# Patient Record
Sex: Female | Born: 1985 | Race: White | Hispanic: No | Marital: Single | Smoking: Former smoker
Health system: Southern US, Community
[De-identification: ages and names within clinical notes are randomized; demographics above are authoritative.]

## PROBLEM LIST (undated history)

## (undated) DIAGNOSIS — F329 Major depressive disorder, single episode, unspecified: Secondary | ICD-10-CM

## (undated) DIAGNOSIS — F419 Anxiety disorder, unspecified: Secondary | ICD-10-CM

## (undated) DIAGNOSIS — F32A Depression, unspecified: Secondary | ICD-10-CM

## (undated) DIAGNOSIS — M549 Dorsalgia, unspecified: Secondary | ICD-10-CM

---

## 2005-10-14 ENCOUNTER — Emergency Department: Payer: Self-pay | Admitting: Emergency Medicine

## 2005-11-09 ENCOUNTER — Emergency Department: Payer: Self-pay | Admitting: Emergency Medicine

## 2005-12-26 ENCOUNTER — Emergency Department: Payer: Self-pay | Admitting: Unknown Physician Specialty

## 2006-03-28 ENCOUNTER — Emergency Department: Payer: Self-pay | Admitting: Emergency Medicine

## 2006-03-28 ENCOUNTER — Other Ambulatory Visit: Payer: Self-pay

## 2007-01-11 ENCOUNTER — Emergency Department: Payer: Self-pay | Admitting: General Practice

## 2007-02-01 ENCOUNTER — Emergency Department: Payer: Self-pay | Admitting: Emergency Medicine

## 2007-03-17 ENCOUNTER — Emergency Department: Payer: Self-pay | Admitting: Internal Medicine

## 2007-12-21 ENCOUNTER — Emergency Department: Payer: Self-pay | Admitting: Internal Medicine

## 2007-12-21 ENCOUNTER — Other Ambulatory Visit: Payer: Self-pay

## 2008-03-08 ENCOUNTER — Emergency Department: Payer: Self-pay | Admitting: Emergency Medicine

## 2008-07-10 ENCOUNTER — Emergency Department: Payer: Self-pay | Admitting: Emergency Medicine

## 2009-03-13 ENCOUNTER — Emergency Department: Payer: Self-pay | Admitting: Emergency Medicine

## 2012-08-30 ENCOUNTER — Ambulatory Visit: Payer: Self-pay | Admitting: Family Medicine

## 2013-02-04 DIAGNOSIS — A64 Unspecified sexually transmitted disease: Secondary | ICD-10-CM | POA: Insufficient documentation

## 2013-02-04 DIAGNOSIS — Z720 Tobacco use: Secondary | ICD-10-CM | POA: Insufficient documentation

## 2013-02-04 DIAGNOSIS — F419 Anxiety disorder, unspecified: Secondary | ICD-10-CM | POA: Insufficient documentation

## 2013-08-26 DIAGNOSIS — N643 Galactorrhea not associated with childbirth: Secondary | ICD-10-CM | POA: Insufficient documentation

## 2014-04-28 ENCOUNTER — Ambulatory Visit: Payer: Self-pay | Admitting: Family Medicine

## 2015-02-08 DIAGNOSIS — M797 Fibromyalgia: Secondary | ICD-10-CM | POA: Insufficient documentation

## 2015-03-16 DIAGNOSIS — F32A Depression, unspecified: Secondary | ICD-10-CM | POA: Insufficient documentation

## 2015-03-16 DIAGNOSIS — F419 Anxiety disorder, unspecified: Secondary | ICD-10-CM | POA: Insufficient documentation

## 2016-05-16 DIAGNOSIS — R202 Paresthesia of skin: Secondary | ICD-10-CM | POA: Insufficient documentation

## 2016-05-16 DIAGNOSIS — E041 Nontoxic single thyroid nodule: Secondary | ICD-10-CM | POA: Insufficient documentation

## 2016-06-07 DIAGNOSIS — D519 Vitamin B12 deficiency anemia, unspecified: Secondary | ICD-10-CM | POA: Insufficient documentation

## 2017-01-23 ENCOUNTER — Ambulatory Visit
Admission: EM | Admit: 2017-01-23 | Discharge: 2017-01-23 | Disposition: A | Discharge status: Home / Self Care | Payer: Medicaid Other | Attending: Family Medicine | Admitting: Family Medicine

## 2017-01-23 ENCOUNTER — Encounter: Payer: Self-pay | Admitting: *Deleted

## 2017-01-23 DIAGNOSIS — S29019A Strain of muscle and tendon of unspecified wall of thorax, initial encounter: Secondary | ICD-10-CM | POA: Diagnosis not present

## 2017-01-23 HISTORY — DX: Major depressive disorder, single episode, unspecified: F32.9

## 2017-01-23 HISTORY — DX: Depression, unspecified: F32.A

## 2017-01-23 HISTORY — DX: Anxiety disorder, unspecified: F41.9

## 2017-01-23 HISTORY — DX: Dorsalgia, unspecified: M54.9

## 2017-01-23 MED ORDER — HYDROCODONE-ACETAMINOPHEN 5-325 MG PO TABS: ORAL_TABLET | ORAL | 0 refills | Status: DC

## 2017-01-23 MED ORDER — CYCLOBENZAPRINE HCL 10 MG PO TABS: 10.0000 mg | ORAL_TABLET | Freq: Every day | ORAL | 0 refills | Status: DC

## 2017-01-23 MED ORDER — KETOROLAC TROMETHAMINE 60 MG/2ML IM SOLN
60.0000 mg | Freq: Once | INTRAMUSCULAR | Status: AC
  Administered 2017-01-23: 60 mg via INTRAMUSCULAR

## 2017-01-23 NOTE — ED Provider Notes (Addendum)
[facility]    CSN: [id number] Arrival date & time: 01/23/17  1548     History   Chief Complaint Chief Complaint  Patient presents with  . Back Pain    HPI Amber Vance is a 31 y.o. female.   The history is provided by the patient.  Back Pain  Location:  Thoracic spine Quality:  Aching Pain severity:  Moderate Pain is:  Same all the time Onset quality:  Sudden Duration:  3 days Timing:  Constant Progression:  Worsening Chronicity:  Recurrent (states has a chronic history of thoracic back problems/pain) Context: not emotional stress, not falling, not jumping from heights, not lifting heavy objects, not MCA, not MVA, not occupational injury, not pedestrian accident, not physical stress, not recent illness, not recent injury and not twisting   Relieved by:  Nothing Worsened by:  Twisting, bending and palpation Ineffective treatments:  OTC medications Associated symptoms: no abdominal pain, no abdominal swelling, no bladder incontinence, no bowel incontinence, no chest pain, no dysuria, no fever, no headaches, no leg pain, no numbness, no paresthesias, no pelvic pain, no perianal numbness, no tingling, no weakness and no weight loss   Risk factors: no hx of cancer, no hx of osteoporosis, no lack of exercise, no menopause, not obese, no recent surgery, no steroid use and no vascular disease     Past Medical History:  Diagnosis Date  . Anxiety   . Back pain   . Depression     There are no active problems to display for this patient.   History reviewed. No pertinent surgical history.  OB History    No data available       Home Medications    Prior to Admission medications   Medication Sig Start Date End Date Taking? Authorizing Provider  baclofen (LIORESAL) 10 MG tablet Take 10 mg by mouth 3 (three) times daily.   Yes Historical Provider, MD  hydrOXYzine (ATARAX/VISTARIL) 50 MG tablet Take 50 mg by mouth 3 (three) times daily as needed.   Yes  Historical Provider, MD  lamoTRIgine (LAMICTAL) 100 MG tablet Take 100 mg by mouth daily.   Yes Historical Provider, MD  meloxicam (MOBIC) 15 MG tablet Take 15 mg by mouth daily.   Yes Historical Provider, MD  metroNIDAZOLE (FLAGYL) 500 MG tablet Take 500 mg by mouth 3 (three) times daily.   Yes Historical Provider, MD  pregabalin (LYRICA) 100 MG capsule Take 100 mg by mouth 2 (two) times daily.   Yes Historical Provider, MD  cyclobenzaprine (FLEXERIL) 10 MG tablet Take 1 tablet (10 mg total) by mouth at bedtime. 01/23/17   Norval Gable, MD  HYDROcodone-acetaminophen (NORCO/VICODIN) 5-325 MG tablet 1-2 tabs po qd prn 01/23/17   Norval Gable, MD  medroxyPROGESTERone (DEPO-PROVERA) 150 MG/ML injection Inject 150 mg into the muscle every 3 (three) months.    Historical Provider, MD    Family History History reviewed. No pertinent family history.  Social History Social History  Substance Use Topics  . Smoking status: Current Every Day Smoker    Packs/day: 0.50    Types: Cigarettes  . Smokeless tobacco: Never Used  . Alcohol use Yes     Allergies   Patient has no known allergies.   Review of Systems Review of Systems  Constitutional: Negative for fever and weight loss.  Cardiovascular: Negative for chest pain.  Gastrointestinal: Negative for abdominal pain and bowel incontinence.  Genitourinary: Negative for bladder incontinence, dysuria and pelvic pain.  Musculoskeletal: Positive for  back pain.  Neurological: Negative for tingling, weakness, numbness, headaches and paresthesias.     Physical Exam Triage Vital Signs ED Triage Vitals  Enc Vitals Group     BP 01/23/17 1633 116/74     Pulse Rate 01/23/17 1633 (!) 101     Resp 01/23/17 1633 16     Temp 01/23/17 1633 98 F (36.7 C)     Temp Source 01/23/17 1633 Oral     SpO2 01/23/17 1633 100 %     Weight 01/23/17 1634 174 lb (78.9 kg)     Height 01/23/17 1634 5\' 5"  (1.651 m)     Head Circumference --      Peak Flow --       Pain Score 01/23/17 1634 10     Pain Loc --      Pain Edu? --      Excl. in [place]? --    No data found.   Updated Vital Signs BP 116/74 (BP Location: Left Arm)   Pulse (!) 101   Temp 98 F (36.7 C) (Oral)   Resp 16   Ht 5\' 5"  (1.651 m)   Wt 174 lb (78.9 kg)   SpO2 100%   BMI 28.96 kg/m   Visual Acuity Right Eye Distance:   Left Eye Distance:   Bilateral Distance:    Right Eye Near:   Left Eye Near:    Bilateral Near:     Physical Exam  Constitutional: She appears well-developed and well-nourished. No distress.  Musculoskeletal: She exhibits tenderness. She exhibits no edema.       Thoracic back: She exhibits tenderness (over the right thoracic paraspinous muscles). She exhibits normal range of motion, no bony tenderness, no swelling, no edema, no deformity, no laceration, no pain, no spasm and normal pulse.       Lumbar back: She exhibits normal range of motion, no tenderness, no bony tenderness, no swelling, no edema, no deformity, no laceration, no pain, no spasm and normal pulse.       Back:  Neurological: She is alert. She has normal reflexes. She displays normal reflexes. She exhibits normal muscle tone.  Skin: Skin is warm and dry. No rash noted. She is not diaphoretic. No erythema.  Nursing note and vitals reviewed.    UC Treatments / Results  Labs (all labs ordered are listed, but only abnormal results are displayed) Labs Reviewed - No data to display  EKG  EKG Interpretation None       Radiology No results found.  Procedures Procedures (including critical care time)  Medications Ordered in UC Medications  ketorolac (TORADOL) injection 60 mg (60 mg Intramuscular Given 01/23/17 1711)     Initial Impression / Assessment and Plan / UC Course  I have reviewed the triage vital signs and the nursing notes.  Pertinent labs & imaging results that were available during my care of the patient were reviewed by me and considered in my medical decision  making (see chart for details).       Final Clinical Impressions(s) / UC Diagnoses   Final diagnoses:  Thoracic myofascial strain, initial encounter    New Prescriptions New Prescriptions   CYCLOBENZAPRINE (FLEXERIL) 10 MG TABLET    Take 1 tablet (10 mg total) by mouth at bedtime.   HYDROCODONE-ACETAMINOPHEN (NORCO/VICODIN) 5-325 MG TABLET    1-2 tabs po qd prn   1.diagnosis reviewed with patient; patient given toradol 60mg  im x1 2. rx as per orders above; reviewed possible side effects,  interactions, risks and benefits  3. Recommend supportive treatment with rest, ice/heat, otc analgesics 4. Follow-up prn if symptoms worsen or don't improve   Norval Gable, MD 01/23/17 [place], MD 01/23/17 2633

## 2017-01-23 NOTE — ED Notes (Signed)
Patient waited 15 minutes and no reactions were noted. Injection site is unremarkable. 

## 2017-01-23 NOTE — ED Triage Notes (Addendum)
PAtient started having right side thoracic back pain 3 days ago. Patient reports the pain shoots up to her shoulder and around her right flank. Patient does have a history of right flank pain.

## 2017-02-09 DIAGNOSIS — M419 Scoliosis, unspecified: Secondary | ICD-10-CM | POA: Insufficient documentation

## 2017-02-09 DIAGNOSIS — M5416 Radiculopathy, lumbar region: Secondary | ICD-10-CM | POA: Insufficient documentation

## 2018-01-02 ENCOUNTER — Emergency Department: Payer: Medicaid Other

## 2018-01-02 ENCOUNTER — Emergency Department
Admission: EM | Admit: 2018-01-02 | Discharge: 2018-01-02 | Disposition: A | Discharge status: Home / Self Care | Payer: Medicaid Other | Attending: Student in an Organized Health Care Education/Training Program | Admitting: Student in an Organized Health Care Education/Training Program

## 2018-01-02 ENCOUNTER — Encounter: Payer: Self-pay | Admitting: Emergency Medicine

## 2018-01-02 ENCOUNTER — Other Ambulatory Visit: Payer: Self-pay

## 2018-01-02 DIAGNOSIS — F419 Anxiety disorder, unspecified: Secondary | ICD-10-CM | POA: Insufficient documentation

## 2018-01-02 DIAGNOSIS — O9989 Other specified diseases and conditions complicating pregnancy, childbirth and the puerperium: Secondary | ICD-10-CM | POA: Diagnosis not present

## 2018-01-02 DIAGNOSIS — R1032 Left lower quadrant pain: Secondary | ICD-10-CM | POA: Insufficient documentation

## 2018-01-02 DIAGNOSIS — F17210 Nicotine dependence, cigarettes, uncomplicated: Secondary | ICD-10-CM | POA: Insufficient documentation

## 2018-01-02 DIAGNOSIS — R109 Unspecified abdominal pain: Secondary | ICD-10-CM

## 2018-01-02 DIAGNOSIS — Z79899 Other long term (current) drug therapy: Secondary | ICD-10-CM | POA: Diagnosis not present

## 2018-01-02 DIAGNOSIS — Z3A10 10 weeks gestation of pregnancy: Secondary | ICD-10-CM

## 2018-01-02 LAB — HCG, QUANTITATIVE, PREGNANCY: hCG, Beta Chain, Quant, S: 108483 m[IU]/mL — ABNORMAL HIGH (ref <5)

## 2018-01-02 NOTE — ED Provider Notes (Signed)
[facility] Emergency Department Provider Note    None    (approximate)  I have reviewed the triage vital signs and the nursing notes.   HISTORY  Chief Complaint "worried about baby"    HPI Amber Vance is a 32 y.o. female history of anxiety presents roughly [redacted] weeks pregnant with concern about fetal well-being.  Denies any vaginal bleeding.  No pain.  No discharge.  No nausea or vomiting.  States that she felt really nervous this morning think that she was further along in her pregnancy and had not yet started feeling the baby kick so was worried that the baby had died so she came to the ER.  Past Medical History:  Diagnosis Date  . Anxiety   . Back pain   . Depression    History reviewed. No pertinent family history. History reviewed. No pertinent surgical history. There are no active problems to display for this patient.     Prior to Admission medications   Medication Sig Start Date End Date Taking? Authorizing Provider  baclofen (LIORESAL) 10 MG tablet Take 10 mg by mouth 3 (three) times daily.    [provider]  cyclobenzaprine (FLEXERIL) 10 MG tablet Take 1 tablet (10 mg total) by mouth at bedtime. 01/23/17   Norval Gable, MD  HYDROcodone-acetaminophen (NORCO/VICODIN) 5-325 MG tablet 1-2 tabs po qd prn 01/23/17   Norval Gable, MD  hydrOXYzine (ATARAX/VISTARIL) 50 MG tablet Take 50 mg by mouth 3 (three) times daily as needed.    [provider]  lamoTRIgine (LAMICTAL) 100 MG tablet Take 100 mg by mouth daily.    [provider]  medroxyPROGESTERone (DEPO-PROVERA) 150 MG/ML injection Inject 150 mg into the muscle every 3 (three) months.    [provider]  meloxicam (MOBIC) 15 MG tablet Take 15 mg by mouth daily.    [provider]  metroNIDAZOLE (FLAGYL) 500 MG tablet Take 500 mg by mouth 3 (three) times daily.    [provider]  pregabalin (LYRICA) 100 MG capsule Take 100 mg by  mouth 2 (two) times daily.    [provider]    Allergies Patient has no known allergies.    Social History Social History   Tobacco Use  . Smoking status: Current Every Day Smoker    Packs/day: 0.50    Types: Cigarettes  . Smokeless tobacco: Never Used  Substance Use Topics  . Alcohol use: Yes  . Drug use: No    Review of Systems Patient denies headaches, rhinorrhea, blurry vision, numbness, shortness of breath, chest pain, edema, cough, abdominal pain, nausea, vomiting, diarrhea, dysuria, fevers, rashes or hallucinations unless otherwise stated above in HPI. ____________________________________________   PHYSICAL EXAM:  VITAL SIGNS: Vitals:   01/02/18 1350  BP: 108/75  Pulse: 85  Resp: 17  Temp: 98.4 F (36.9 C)  SpO2: 100%    Constitutional: Alert and oriented. Well appearing and in no acute distress. Eyes: Conjunctivae are normal.  Head: Atraumatic. Nose: No congestion/rhinnorhea. Mouth/Throat: Mucous membranes are moist.   Neck: Painless ROM.  Cardiovascular:   Good peripheral circulation. Respiratory: Normal respiratory effort.  No retractions.  Gastrointestinal: Soft and nontender.  Musculoskeletal: No lower extremity tenderness .  No joint effusions. Neurologic:  Normal speech and language. No gross focal neurologic deficits are appreciated.  Skin:  Skin is warm, dry and intact. No rash noted. Psychiatric: Mood and affect are normal. Speech and behavior are normal.  ____________________________________________   LABS (all labs ordered  are listed, but only abnormal results are displayed)  Results for orders placed or performed during the hospital encounter of 01/02/18 (from the past 24 hour(s))  hCG, quantitative, pregnancy     Status: Abnormal   Collection Time: 01/02/18  2:13 PM  Result Value Ref Range   hCG, Beta Chain, Quant, S 324,401 (H) <5 mIU/mL    ____________________________________________ ____________________________________________  RADIOLOGY  I personally reviewed all radiographic images ordered to evaluate for the above acute complaints and reviewed radiology reports and findings.  These findings were personally discussed with the patient.  Please see medical record for radiology report.  ____________________________________________   PROCEDURES  Procedure(s) performed:  Procedures    Critical Care performed: no ____________________________________________   INITIAL IMPRESSION / ASSESSMENT AND PLAN / ED COURSE  Pertinent labs & imaging results that were available during my care of the patient were reviewed by me and considered in my medical decision making (see chart for details).  DDX: Threatened AB, ectopic, miscarriage, healthy pregnancy  Amber Vance is a 32 y.o. who presents to the ED with symptoms as described above.  Patient afebrile [name] dynamically stable.  Ultrasound ordered to evaluate for fetal well-being shows reassuring fetal ultrasound.  No evidence of ectopic.  No signs or symptoms to suggest miscarriage.  Patient stable and appropriate for outpatient follow-up.      ____________________________________________   FINAL CLINICAL IMPRESSION(S) / ED DIAGNOSES  Final diagnoses:  [redacted] weeks gestation of pregnancy      NEW MEDICATIONS STARTED DURING THIS VISIT:  New Prescriptions   No medications on file     Note:  This document was prepared using Dragon voice recognition software and may include unintentional dictation errors.    Merlyn Lot, MD 01/02/18 [phone]

## 2018-01-02 NOTE — ED Triage Notes (Signed)
Had a tight feeling in stomach this morning that has now eased.  Pt denies vaginal bleeding.  Was at doctor end February and they told her she was pregnant about 15-18 per HCG.  Was then incarcerated for couple days so has had no prenatal care.  Pt just wants to make sure baby is ok because nervous.  A4T3M4.

## 2018-01-02 NOTE — ED Notes (Signed)

## 2018-01-02 NOTE — ED Triage Notes (Signed)
Unable to find fetal heart tones with doppler in triage.

## 2018-02-26 DIAGNOSIS — O0992 Supervision of high risk pregnancy, unspecified, second trimester: Secondary | ICD-10-CM | POA: Insufficient documentation

## 2018-06-14 DIAGNOSIS — O99330 Smoking (tobacco) complicating pregnancy, unspecified trimester: Secondary | ICD-10-CM | POA: Insufficient documentation

## 2018-06-14 DIAGNOSIS — A630 Anogenital (venereal) warts: Secondary | ICD-10-CM | POA: Insufficient documentation

## 2018-07-19 DIAGNOSIS — Z3A39 39 weeks gestation of pregnancy: Secondary | ICD-10-CM | POA: Insufficient documentation

## 2018-12-25 DIAGNOSIS — M2012 Hallux valgus (acquired), left foot: Secondary | ICD-10-CM | POA: Insufficient documentation

## 2019-05-29 ENCOUNTER — Other Ambulatory Visit: Payer: Self-pay

## 2019-05-29 DIAGNOSIS — Z20822 Contact with and (suspected) exposure to covid-19: Secondary | ICD-10-CM

## 2019-05-30 LAB — NOVEL CORONAVIRUS, NAA: SARS-CoV-2, NAA: NOT DETECTED

## 2019-06-02 ENCOUNTER — Telehealth: Payer: Self-pay

## 2019-06-02 NOTE — Telephone Encounter (Signed)
Informed pt of negative covid result   °

## 2019-07-01 ENCOUNTER — Other Ambulatory Visit: Payer: Self-pay

## 2019-07-01 DIAGNOSIS — Z20822 Contact with and (suspected) exposure to covid-19: Secondary | ICD-10-CM

## 2019-07-02 LAB — NOVEL CORONAVIRUS, NAA: SARS-CoV-2, NAA: NOT DETECTED

## 2019-07-03 ENCOUNTER — Telehealth: Payer: Self-pay | Admitting: General Practice

## 2019-07-03 NOTE — Telephone Encounter (Signed)
Negative COVID results given. Patient results "NOT Detected." Caller expressed understanding. ° °

## 2019-08-25 ENCOUNTER — Other Ambulatory Visit: Payer: Self-pay

## 2019-08-25 DIAGNOSIS — Z20822 Contact with and (suspected) exposure to covid-19: Secondary | ICD-10-CM

## 2019-08-26 LAB — NOVEL CORONAVIRUS, NAA: SARS-CoV-2, NAA: NOT DETECTED

## 2019-08-27 ENCOUNTER — Telehealth: Payer: Self-pay

## 2019-08-27 NOTE — Telephone Encounter (Signed)
Negative COVID results given. Patient results "NOT Detected." Caller expressed understanding. ° °

## 2020-01-09 ENCOUNTER — Encounter: Payer: Self-pay | Admitting: Emergency Medicine

## 2020-01-09 ENCOUNTER — Other Ambulatory Visit: Payer: Self-pay

## 2020-01-09 ENCOUNTER — Emergency Department: Payer: Medicaid Other

## 2020-01-09 ENCOUNTER — Emergency Department
Admission: EM | Admit: 2020-01-09 | Discharge: 2020-01-09 | Disposition: A | Discharge status: Home / Self Care | Payer: Medicaid Other | Attending: Emergency Medicine | Admitting: Emergency Medicine

## 2020-01-09 DIAGNOSIS — R2242 Localized swelling, mass and lump, left lower limb: Secondary | ICD-10-CM | POA: Diagnosis not present

## 2020-01-09 DIAGNOSIS — F17210 Nicotine dependence, cigarettes, uncomplicated: Secondary | ICD-10-CM | POA: Diagnosis not present

## 2020-01-09 DIAGNOSIS — M25572 Pain in left ankle and joints of left foot: Secondary | ICD-10-CM | POA: Insufficient documentation

## 2020-01-09 MED ORDER — IBUPROFEN 800 MG PO TABS: 800.0000 mg | ORAL_TABLET | Freq: Three times a day (TID) | ORAL | 0 refills | Status: DC | PRN

## 2020-01-09 NOTE — ED Triage Notes (Signed)
Pt ambulatory to triage with c/o left ankle swelling and pain since Tuesday.  Pt denies injury.  States started after a "long shift" at work.  PMS intact.  Swelling does not extend past ankle.

## 2020-01-09 NOTE — ED Notes (Signed)
See triage note States she developed pain to left ankle  Denies any recent injury  Noticed swelling 3 days ago  Ambulates well  Good pulses

## 2020-01-09 NOTE — ED Provider Notes (Signed)
[facility] Emergency Department Provider Note       Time seen: ----------------------------------------- 1:10 PM on 01/09/2020 -----------------------------------------   I have reviewed the triage vital signs and the nursing notes.  HISTORY   Chief Complaint Ankle Pain    HPI Amber Vance is a 34 y.o. female with a history of anxiety, back pain, depression who presents to the ED for left ankle pain and swelling since Tuesday.  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  Discomfort is 2 out of 10.  Past Medical History:  Diagnosis Date  . Anxiety   . Back pain   . Depression     There are no problems to display for this patient.   History reviewed. No pertinent surgical history.  Allergies Patient has no known allergies.  Social History Social History   Tobacco Use  . Smoking status: Current Every Day Smoker    Packs/day: 0.50    Types: Cigarettes  . Smokeless tobacco: Never Used  Substance Use Topics  . Alcohol use: Yes  . Drug use: No    Review of Systems Constitutional: Negative for fever. Musculoskeletal: Positive for ankle pain Skin: Negative for rash. Neurological: Negative for headaches, focal weakness or numbness.  All systems negative/normal/unremarkable except as stated in the HPI  ____________________________________________   PHYSICAL EXAM:  VITAL SIGNS: ED Triage Vitals [01/09/20 1259]  Enc Vitals Group     BP 134/80     Pulse      Resp 18     Temp 98 F (36.7 C)     Temp Source Oral     SpO2 100 %     Weight 179 lb (81.2 kg)     Height 5\' 5"  (1.651 m)     Head Circumference      Peak Flow      Pain Score 2     Pain Loc      Pain Edu?      Excl. in [place]?     Constitutional: Alert and oriented. Well appearing and in no distress. Musculoskeletal: No significant pain with range of motion of the ankle, there is mild to moderate left ankle swelling, particular  over the lateral aspect compared to the right Neurologic:  Normal speech and language. No gross focal neurologic deficits are appreciated.  Skin:  Skin is warm, dry and intact. No rash noted. Psychiatric: Mood and affect are normal. Speech and behavior are normal.  ____________________________________________  ED COURSE:  As part of my medical decision making, I reviewed the following data within the [place] History obtained from family if available, nursing notes, old chart and ekg, as well as notes from prior ED visits. Patient presented for left ankle pain and swelling, we will assess with labs and imaging as indicated at this time.   Procedures  Amber Vance was evaluated in Emergency Department on 01/09/2020 for the symptoms described in the history of present illness. She was evaluated in the context of the global COVID-19 pandemic, which necessitated consideration that the patient might be at risk for infection with the SARS-CoV-2 virus that causes COVID-19. Institutional protocols and algorithms that pertain to the evaluation of patients at risk for COVID-19 are in a state of rapid change based on information released by regulatory bodies including the CDC and federal and state organizations. These policies and algorithms were followed during the patient's care in the ED.  ____________________________________________   RADIOLOGY  Images were viewed by me  Left ankle x-ray IMPRESSION:  Negative.  ____________________________________________   DIFFERENTIAL DIAGNOSIS   Peripheral edema, sprain, fracture unlikely, venous stasis  FINAL ASSESSMENT AND PLAN  Ankle pain and swelling   Plan: The patient had presented for left ankle pain and swelling. Patient's imaging did not reveal any acute process.  I will encourage TED hose if she is going to be standing for long periods of time.  X-rays were reassuring.  She is cleared for outpatient  follow-up.   Laurence Aly, MD    Note: This note was generated in part or whole with voice recognition software. Voice recognition is usually quite accurate but there are transcription errors that can and very often do occur. I apologize for any typographical errors that were not detected and corrected.     Earleen Newport, MD 01/09/20 1350

## 2020-01-29 ENCOUNTER — Other Ambulatory Visit: Payer: Self-pay

## 2020-01-29 ENCOUNTER — Ambulatory Visit (INDEPENDENT_AMBULATORY_CARE_PROVIDER_SITE_OTHER): Payer: Medicaid Other

## 2020-01-29 ENCOUNTER — Ambulatory Visit: Payer: Medicaid Other | Admitting: Podiatry

## 2020-01-29 ENCOUNTER — Other Ambulatory Visit: Payer: Self-pay | Admitting: Podiatry

## 2020-01-29 ENCOUNTER — Encounter: Payer: Self-pay | Admitting: Podiatry

## 2020-01-29 DIAGNOSIS — M778 Other enthesopathies, not elsewhere classified: Secondary | ICD-10-CM

## 2020-01-29 DIAGNOSIS — M7752 Other enthesopathy of left foot: Secondary | ICD-10-CM | POA: Diagnosis not present

## 2020-01-29 DIAGNOSIS — M7751 Other enthesopathy of right foot: Secondary | ICD-10-CM

## 2020-01-29 DIAGNOSIS — M79671 Pain in right foot: Secondary | ICD-10-CM

## 2020-01-30 ENCOUNTER — Encounter: Payer: Self-pay | Admitting: Podiatry

## 2020-01-30 NOTE — Progress Notes (Signed)
Subjective:  Patient ID: Amber Vance, female    DOB: [birth year],  MRN: [id number]  Chief Complaint  Patient presents with  . Foot Pain    Patient presents today for left ankle pain and right foot pain  . Ankle Pain    34 y.o. female presents with the above complaint.  Patient presents with complaint of left first interspace neuroma and right second metatarsophalangeal joint capsulitis.  Patient states that this has been going on for quite some time.  Patient states that his foot feels painful feels like that they are broken and is been going from past 2 to 3 days.  She states is worse when walking.  She has tried Lyrica is on gabapentin and ibuprofen but has not helped.  She also has secondary complaint of left ankle pain.  There was swelling associated with it for past 2 weeks.  She had x-rays done at the ER which showed no fractures.  She states that there is shooting pain up the leg.  There is also pain on top of the foot.  The cortisone injection in the past has been injected in the foot which does give her temporary relief.  She denies any other acute complaints.  She states that she has seen previous physician in the past long time ago but has not followed up with him after the injections.  She states that she could works on her feet all the time and she does not have good support of the arch as well as shoes.   Review of Systems: Negative except as noted in the HPI. Denies N/V/F/Ch.  Past Medical History:  Diagnosis Date  . Anxiety   . Back pain   . Depression     Current Outpatient Medications:  .  baclofen (LIORESAL) 10 MG tablet, Take 10 mg by mouth 3 (three) times daily., Disp: , Rfl:  .  gabapentin (NEURONTIN) 800 MG tablet, Take 800 mg by mouth 3 (three) times daily., Disp: , Rfl:  .  hydrOXYzine (ATARAX/VISTARIL) 50 MG tablet, Take 50 mg by mouth 3 (three) times daily as needed., Disp: , Rfl:  .  meloxicam (MOBIC) 15 MG tablet, Take 15 mg by mouth daily., Disp: ,  Rfl:  .  pregabalin (LYRICA) 100 MG capsule, Take 100 mg by mouth 2 (two) times daily., Disp: , Rfl:  .  Vitamin D, Ergocalciferol, (DRISDOL) 1.25 MG (50000 UNIT) CAPS capsule, Take 50,000 Units by mouth once a week., Disp: , Rfl:   Social History   Tobacco Use  Smoking Status Current Every Day Smoker  . Packs/day: 0.50  . Types: Cigarettes  Smokeless Tobacco Never Used    Allergies  Allergen Reactions  . Duloxetine Swelling    Swollen eyelids   Objective:  There were no vitals filed for this visit. There is no height or weight on file to calculate BMI. Constitutional Well developed. Well nourished.  Vascular Dorsalis pedis pulses palpable bilaterally. Posterior tibial pulses palpable bilaterally. Capillary refill normal to all digits.  No cyanosis or clubbing noted. Pedal hair growth normal.  Neurologic Normal speech. Oriented to person, place, and time. Epicritic sensation to light touch grossly present bilaterally.  Dermatologic Nails well groomed and normal in appearance. No open wounds. No skin lesions.  Orthopedic:  Pain on palpation to the right second metatarsophalangeal joint.  Pain with range of motion.  No extensor or flexor pain noted with range of motion.  Negative Tinel's sign.  No neuromas noted.  Left first interspace  Tinel's sign noted.  Positive Mulder's click.  Pain on palpation to the first interspace left     Radiographs: 3 views of skeletally mature adult right foot.  No stress fractures noted.  No periosteal reactions noted.  There is a bunion deformity present with sesamoid position of 6 out of 7.  There is hallux valgus deformity.  There is increase in intermetatarsal angle.  No hammertoe contractures noted.  No arthritic changes noted. Assessment:   1. Right foot pain    Plan:  Patient was evaluated and treated and all questions answered.  Right second metatarsophalangeal joint capsulitis -I explained to the patient the etiology of capsulitis  and various treatment options were discussed.  I believe she will benefit from a steroid injection to help decreasing acute inflammatory component associated with this.  Patient agrees with the plan would like to proceed with injection. -A steroid injection was performed at right second metatarsophalangeal joint using 1% plain Lidocaine and 10 mg of Kenalog. This was well tolerated. -Shoe gear modifications were extensively discussed with the patient. -Metatarsal pads were dispensed  Left first interspace neuroma -I explained to the patient the etiology of neuroma and various treatment options were extensively discussed.  However, clinically it is difficult to rule out capsulitis as well.  I plan on performing a steroid injection into rule out capsulitis if her pain has decreased with steroid injection it was likely capsulitis as opposed to neuroma.  Patient states understanding like to proceed with injection. -A steroid injection was performed at left 1st interspace using 1% plain Lidocaine and 10 mg of Kenalog. This was well tolerated. -Metatarsal pads were dispensed  Pes planus semiflexible -I explained to the patient the etiology of pes planus deformity and various treatment options were discussed extensively.  Given the pain that she is having in the right second metatarsophalangeal joint as well as left first interspace I believe she will benefit from custom-made orthotics to help support the arch of the foot as well as control the hindfoot motion.  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  I believe good arch support will definitely help with this. -Patient will be scheduled to see [facility] for custom-made orthotics   No follow-ups on file.

## 2020-02-03 ENCOUNTER — Ambulatory Visit: Payer: Self-pay | Admitting: Podiatry

## 2020-02-09 ENCOUNTER — Telehealth: Payer: Self-pay | Admitting: Podiatry

## 2020-02-09 NOTE — Telephone Encounter (Signed)
I spoke with patient, she stated that she is really wanting an injection because her heel and ankle is now hurting really bad from walking differently from the neuroma.  She stated she has tried ice, heat, elevations, Epson salt soaks and nothing works.  She states, "Im just looking forward to an injection tomorrow"   I instructed her to keep foot elevated and use ice and we will see her tomorrow

## 2020-02-09 NOTE — Telephone Encounter (Signed)
Pt has appt tomorrow with Dr Amalia Hailey for f/u after injections-having a lot of pain now in right ankle.  Pt would like a call today because she has questions re: injections and other treatments available.  Pt had appt tomorrow also with Liliane Channel for orthotics but she cancelled because she feels its not the right time.

## 2020-02-10 ENCOUNTER — Other Ambulatory Visit: Payer: Medicaid Other | Admitting: Orthotics

## 2020-02-10 ENCOUNTER — Ambulatory Visit: Payer: Medicaid Other | Admitting: Podiatry

## 2020-02-10 ENCOUNTER — Encounter: Payer: Self-pay | Admitting: Podiatry

## 2020-02-10 ENCOUNTER — Other Ambulatory Visit: Payer: Self-pay

## 2020-02-10 DIAGNOSIS — M722 Plantar fascial fibromatosis: Secondary | ICD-10-CM

## 2020-02-10 DIAGNOSIS — M7661 Achilles tendinitis, right leg: Secondary | ICD-10-CM | POA: Diagnosis not present

## 2020-02-10 DIAGNOSIS — M79671 Pain in right foot: Secondary | ICD-10-CM

## 2020-02-10 DIAGNOSIS — D3610 Benign neoplasm of peripheral nerves and autonomic nervous system, unspecified: Secondary | ICD-10-CM

## 2020-02-11 ENCOUNTER — Encounter: Payer: Self-pay | Admitting: Podiatry

## 2020-02-11 NOTE — Progress Notes (Signed)
Subjective:  Patient ID: Amber Vance, female    DOB: 1985-10-29,  MRN: [id number]  Chief Complaint  Patient presents with  . Foot Pain    Patient presents today for right heel/ankle/leg pain which started after her last visit.  She states "I think I was walking on my heel and walking wrong from the neuroma"  She request an injection today    34 y.o. female presents with the above complaint.  Patient presents with a follow-up of left first interspace neuroma as well as right second metatarsophalangeal joint capsulitis.  She states the injection did help a lot.  However she has different complaints today with right plantar fasciitis as well as right Achilles tendinitis.  She states that this started bothering her quite some time ago.  And has progressively gotten worse.  She states she is very active on her foot and therefore the pain has considerably gotten worse.  Patient states is very severe pain scale 10 out of 10.  She would like to know if she can get injection to help with this.  Gabapentin is not helping.  She denies any other acute complaints.   Review of Systems: Negative except as noted in the HPI. Denies N/V/F/Ch.  Past Medical History:  Diagnosis Date  . Anxiety   . Back pain   . Depression     Current Outpatient Medications:  .  baclofen (LIORESAL) 10 MG tablet, Take 10 mg by mouth 3 (three) times daily., Disp: , Rfl:  .  gabapentin (NEURONTIN) 800 MG tablet, Take 800 mg by mouth 3 (three) times daily., Disp: , Rfl:  .  hydrOXYzine (ATARAX/VISTARIL) 50 MG tablet, Take 50 mg by mouth 3 (three) times daily as needed., Disp: , Rfl:  .  meloxicam (MOBIC) 15 MG tablet, Take 15 mg by mouth daily., Disp: , Rfl:  .  pregabalin (LYRICA) 100 MG capsule, Take 100 mg by mouth 2 (two) times daily., Disp: , Rfl:  .  Vitamin D, Ergocalciferol, (DRISDOL) 1.25 MG (50000 UNIT) CAPS capsule, Take 50,000 Units by mouth once a week., Disp: , Rfl:   Social History   Tobacco Use    Smoking Status Current Every Day Smoker  . Packs/day: 0.50  . Types: Cigarettes  Smokeless Tobacco Never Used    Allergies  Allergen Reactions  . Duloxetine Swelling    Swollen eyelids   Objective:  There were no vitals filed for this visit. There is no height or weight on file to calculate BMI. Constitutional Well developed. Well nourished.  Vascular Dorsalis pedis pulses palpable bilaterally. Posterior tibial pulses palpable bilaterally. Capillary refill normal to all digits.  No cyanosis or clubbing noted. Pedal hair growth normal.  Neurologic Normal speech. Oriented to person, place, and time. Epicritic sensation to light touch grossly present bilaterally.  Dermatologic Nails well groomed and normal in appearance. No open wounds. No skin lesions.  Orthopedic:  Pain on palpation to the right second metatarsophalangeal joint.  Pain with range of motion.  No extensor or flexor pain noted with range of motion.  Negative Tinel's sign.  No neuromas noted.  Left first interspace Tinel's sign noted.  Positive Mulder's click.  Pain on palpation to the first interspace left     Radiographs: 3 views of skeletally mature adult right foot.  No stress fractures noted.  No periosteal reactions noted.  There is a bunion deformity present with sesamoid position of 6 out of 7.  There is hallux valgus deformity.  There is increase  in intermetatarsal angle.  No hammertoe contractures noted.  No arthritic changes noted. Assessment:   1. Right foot pain   2. Plantar fasciitis of right foot   3. Achilles tendinitis, right leg    Plan:  Patient was evaluated and treated and all questions answered.  Right Achilles tendinitis -I explained the patient the etiology of Achilles tendinitis and its relationship with plantar fasciitis and various treatment options were extensively discussed.  I believe patient will also benefit from a steroid injection to help decrease the inflammatory component  associated with pain.  Patient states understanding and would like to proceed with injection.  I did discuss with the patient in extensive detail that there is a high risk of rupture with steroid injections.  Patient states understanding and would like to proceed despite the risks. -A steroid injection was performed at right Achilles Kager's fat using 1% plain Lidocaine and 10 mg of Kenalog. This was well tolerated.   Right plantar fasciitis -I explained to the patient the etiology of plantar fasciitis and various treatment options were extensively discussed.  I believe patient will benefit from steroid injection to help decrease the acute inflammatory component associated pain.  Patient states understanding would like to proceed with injection. A steroid injection was performed at right plantar heel using 1% plain Lidocaine and 10 mg of Kenalog. This was well tolerated. -Plantar fascial brace was also dispensed  Right second metatarsophalangeal joint capsulitis -Resolving with a steroid injection.  I will hold off on any further injection as the pain has resolved. -Metatarsal pads are helping.  Left first interspace neuroma -Resolving with a steroid injection.  I will hold off on any further injection as the pain has resolved. -Metatarsal pads were dispensed  Pes planus semiflexible -I explained to the patient the etiology of pes planus deformity and various treatment options were discussed extensively.  Given the pain that she is having in the right second metatarsophalangeal joint as well as left first interspace I believe she will benefit from custom-made orthotics to help support the arch of the foot as well as control the hindfoot motion.  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  I believe good arch support will definitely help with this. -I will discuss with her about custom-made orthotics in the  future.   No follow-ups on file.

## 2020-02-12 ENCOUNTER — Other Ambulatory Visit: Payer: Medicaid Other

## 2020-03-18 ENCOUNTER — Other Ambulatory Visit: Payer: Self-pay

## 2020-03-18 ENCOUNTER — Ambulatory Visit: Payer: Medicaid Other | Admitting: Podiatry

## 2020-03-18 DIAGNOSIS — M79671 Pain in right foot: Secondary | ICD-10-CM

## 2020-03-18 DIAGNOSIS — M722 Plantar fascial fibromatosis: Secondary | ICD-10-CM

## 2020-03-18 DIAGNOSIS — M7671 Peroneal tendinitis, right leg: Secondary | ICD-10-CM

## 2020-03-18 DIAGNOSIS — M7661 Achilles tendinitis, right leg: Secondary | ICD-10-CM

## 2020-03-24 ENCOUNTER — Encounter: Payer: Self-pay | Admitting: Podiatry

## 2020-03-24 NOTE — Progress Notes (Signed)
Subjective:  Patient ID: Amber Vance, female    DOB: 21-Feb-1986,  MRN: [id number]  Chief Complaint  Patient presents with  . Foot Pain    pt is here for right foot pain, pt states that the right foot is still in pain since the last time she was here, pt states that the pain has moved to the lateral side of the right foot, and is looking to get it looked at.    34 y.o. female presents with the above complaint.  Patient presents with a follow-up of left first interspace neuroma as well as right second metatarsophalangeal joint capsulitis.  She states the injection did help a lot.  However she has different complaints today with right plantar fasciitis as well as right Achilles tendinitis.  She states that this started bothering her quite some time ago.  And has progressively gotten worse.  She states she is very active on her foot and therefore the pain has considerably gotten worse.  Patient states is very severe pain scale 10 out of 10.  She would like to know if she can get injection to help with this.  Gabapentin is not helping.  She denies any other acute complaints.   Review of Systems: Negative except as noted in the HPI. Denies N/V/F/Ch.  Past Medical History:  Diagnosis Date  . Anxiety   . Back pain   . Depression     Current Outpatient Medications:  .  baclofen (LIORESAL) 10 MG tablet, Take 10 mg by mouth 3 (three) times daily., Disp: , Rfl:  .  gabapentin (NEURONTIN) 800 MG tablet, Take 800 mg by mouth 3 (three) times daily., Disp: , Rfl:  .  hydrOXYzine (ATARAX/VISTARIL) 50 MG tablet, Take 50 mg by mouth 3 (three) times daily as needed., Disp: , Rfl:  .  medroxyPROGESTERone (DEPO-PROVERA) 150 MG/ML injection, Inject into the muscle., Disp: , Rfl:  .  meloxicam (MOBIC) 15 MG tablet, Take 15 mg by mouth daily., Disp: , Rfl:  .  pregabalin (LYRICA) 100 MG capsule, Take 100 mg by mouth 2 (two) times daily., Disp: , Rfl:  .  Vitamin D, Ergocalciferol, (DRISDOL) 1.25 MG  (50000 UNIT) CAPS capsule, Take 50,000 Units by mouth once a week., Disp: , Rfl:   Social History   Tobacco Use  Smoking Status Current Every Day Smoker  . Packs/day: 0.50  . Types: Cigarettes  Smokeless Tobacco Never Used    Allergies  Allergen Reactions  . Duloxetine Swelling    Swollen eyelids   Objective:  There were no vitals filed for this visit. There is no height or weight on file to calculate BMI. Constitutional Well developed. Well nourished.  Vascular Dorsalis pedis pulses palpable bilaterally. Posterior tibial pulses palpable bilaterally. Capillary refill normal to all digits.  No cyanosis or clubbing noted. Pedal hair growth normal.  Neurologic Normal speech. Oriented to person, place, and time. Epicritic sensation to light touch grossly present bilaterally.  Dermatologic Nails well groomed and normal in appearance. No open wounds. No skin lesions.  Orthopedic:  No pain on palpation to the right second metatarsophalangeal joint.  Pain with range of motion.  No extensor or flexor pain noted with range of motion.  Negative Tinel's sign.  No neuromas noted.  No Mulder's click no pain on palpation to the first interspace.  Pain on palpation of right peroneal tendon along the course of the tendon.  No pain at the insertion of the tendon.  Pain with inversion and plantarflexion of  the tendon.  No pain with eversion.  These findings are consistent with both active and passive range of motion.     Radiographs: 3 views of skeletally mature adult right foot.  No stress fractures noted.  No periosteal reactions noted.  There is a bunion deformity present with sesamoid position of 6 out of 7.  There is hallux valgus deformity.  There is increase in intermetatarsal angle.  No hammertoe contractures noted.  No arthritic changes noted. Assessment:   1. Plantar fasciitis of right foot   2. Achilles tendinitis, right leg   3. Right foot pain   4. Peroneal tendinitis, right     Plan:  Patient was evaluated and treated and all questions answered.  Right Achilles tendinitis -Resolved with a steroid injection and continue stretching exercises.  I discussed this with patient extensive detail.  Right peroneal tendinitis -I explained to patient the etiology of peroneal tendinitis and various treatment options were extensively discussed.  We discussed with her that she is very aggressive on her foot and it seems to be aggravating various tendon pathology.  I believe she will benefit from a steroid injection help decrease the acute inflammatory component associated with pain.  Patient states understanding would like purchasing with the injection.  I explained to the patient that given that this near the tendon and the tendon sheath there is a high risk of rupture.  Patient still wishes to give the injection. -A steroid injection was performed at right peroneal tendinitis using 1% plain Lidocaine and 10 mg of Kenalog. This was well tolerated. -If there is no resolve meant of pain we will consider getting an MRI of as well as a cam boot immobilization.  Patient agrees with plan    Right plantar fasciitis -Resolved with a steroid injection and plantar fascial brace.  Right second metatarsophalangeal joint capsulitis -Resolving with a steroid injection.  I will hold off on any further injection as the pain has resolved. -Metatarsal pads are helping.  Left first interspace neuroma -Resolving with a steroid injection.  I will hold off on any further injection as the pain has resolved. -Metatarsal pads were dispensed  Pes planus semiflexible -I explained to the patient the etiology of pes planus deformity and various treatment options were discussed extensively.  Given the pain that she is having in the right second metatarsophalangeal joint as well as left first interspace I believe she will benefit from custom-made orthotics to help support the arch of the foot as well as  control the hindfoot motion.  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  I believe good arch support will definitely help with this. -I will discuss with her about custom-made orthotics in the future.   No follow-ups on file.

## 2020-04-05 ENCOUNTER — Ambulatory Visit: Payer: Medicaid Other | Admitting: Podiatry

## 2020-04-15 ENCOUNTER — Ambulatory Visit: Payer: Medicaid Other | Admitting: Podiatry

## 2020-05-10 ENCOUNTER — Other Ambulatory Visit: Payer: Self-pay

## 2020-05-10 ENCOUNTER — Emergency Department: Payer: Medicaid Other

## 2020-05-10 ENCOUNTER — Telehealth: Payer: Self-pay | Admitting: Podiatry

## 2020-05-10 ENCOUNTER — Emergency Department
Admission: EM | Admit: 2020-05-10 | Discharge: 2020-05-10 | Disposition: A | Discharge status: Home / Self Care | Payer: Medicaid Other | Attending: Emergency Medicine | Admitting: Emergency Medicine

## 2020-05-10 DIAGNOSIS — I82462 Acute embolism and thrombosis of left calf muscular vein: Secondary | ICD-10-CM

## 2020-05-10 DIAGNOSIS — M79605 Pain in left leg: Secondary | ICD-10-CM | POA: Diagnosis present

## 2020-05-10 DIAGNOSIS — F17210 Nicotine dependence, cigarettes, uncomplicated: Secondary | ICD-10-CM | POA: Insufficient documentation

## 2020-05-10 DIAGNOSIS — I82402 Acute embolism and thrombosis of unspecified deep veins of left lower extremity: Secondary | ICD-10-CM | POA: Insufficient documentation

## 2020-05-10 DIAGNOSIS — M7989 Other specified soft tissue disorders: Secondary | ICD-10-CM

## 2020-05-10 MED ORDER — APIXABAN (ELIQUIS) VTE STARTER PACK (10MG AND 5MG): ORAL_TABLET | ORAL | 0 refills | Status: DC

## 2020-05-10 NOTE — ED Triage Notes (Signed)
Pt comes in POV with left lower leg pain/swelling/color change. Able to find pedal pulse. Swelling starting to go past knee. Started about a week ago.

## 2020-05-10 NOTE — Discharge Instructions (Addendum)
Your evaluation was incomplete at the time of your discharge. You had to leave prior to the completion of your work-up for leg swelling and bruising. Return to this ED immediately for further evaluation and management.

## 2020-05-10 NOTE — ED Provider Notes (Signed)
[facility] Emergency Department Provider Note ____________________________________________  Time seen: 1651  I have reviewed the triage vital signs and the nursing notes.  HISTORY  Chief Complaint  Leg Pain  HPI Amber Vance is a 34 y.o. female presents her self to the ED at the advice of her urgent care provider, for evaluation of left leg pain, swelling, and bruising.  Patient describes  about a 1 week complaint of swelling that started at the left ankle, has progressed to the anterior shin.  Patient denies any injury, trauma, sprain, or fall.  She does report pain with outpatient over the foot and ankle.  Also reporting tenderness and pain over the anterior shin.  Patient denies any chest pain, shortness of breath, cough, or fevers.  She also denies any history of DVT/PE.  She does have a lumbar radiculopathy for which she takes gabapentin, meloxicam, and baclofen. She is a current smoker but denies OCP or recent prolonged travel.   Past Medical History:  Diagnosis Date  . Anxiety   . Back pain   . Depression     Patient Active Problem List   Diagnosis Date Noted  . Hav (hallux abducto valgus), left 12/25/2018  . [redacted] weeks gestation of pregnancy 07/19/2018  . HPV (human papilloma virus) anogenital infection 06/14/2018  . Tobacco smoking affecting pregnancy 06/14/2018  . Lumbar radiculopathy 02/09/2017  . Scoliosis of thoracolumbar spine 02/09/2017  . Anemia due to vitamin B12 deficiency 06/07/2016  . Paresthesia 05/16/2016  . Thyroid nodule 05/16/2016  . Anxiety and depression 03/16/2015  . Fibromyalgia 02/08/2015  . Galactorrhea 08/26/2013  . Anxiety 02/04/2013  . STD (female) 02/04/2013  . Tobacco use 02/04/2013    History reviewed. No pertinent surgical history.  Prior to Admission medications   Medication Sig Start Date End Date Taking? Authorizing Provider  APIXABAN (ELIQUIS) VTE STARTER PACK (10MG  AND 5MG ) Take as directed on package:  start with two-5mg  tablets twice daily for 7 days. On day 8, switch to one-5mg  tablet twice daily. 05/10/20   Aimi Essner, Dannielle Karvonen, PA-C  baclofen (LIORESAL) 10 MG tablet Take 10 mg by mouth 3 (three) times daily.    [provider]  gabapentin (NEURONTIN) 800 MG tablet Take 800 mg by mouth 3 (three) times daily. 12/19/19   [provider]  hydrOXYzine (ATARAX/VISTARIL) 50 MG tablet Take 50 mg by mouth 3 (three) times daily as needed.    [provider]  medroxyPROGESTERone (DEPO-PROVERA) 150 MG/ML injection Inject into the muscle. 03/21/19   [provider]  meloxicam (MOBIC) 15 MG tablet Take 15 mg by mouth daily.    [provider]  pregabalin (LYRICA) 100 MG capsule Take 100 mg by mouth 2 (two) times daily.    [provider]  Vitamin D, Ergocalciferol, (DRISDOL) 1.25 MG (50000 UNIT) CAPS capsule Take 50,000 Units by mouth once a week. 10/22/19   [provider]    Allergies Duloxetine  History reviewed. No pertinent family history.  Social History Social History   Tobacco Use  . Smoking status: Current Every Day Smoker    Packs/day: 0.50    Types: Cigarettes  . Smokeless tobacco: Never Used  Substance Use Topics  . Alcohol use: Yes  . Drug use: No    Review of Systems  Constitutional: Negative for fever. Cardiovascular: Negative for chest pain. Respiratory: Negative for shortness of breath. Gastrointestinal: Negative for abdominal pain, vomiting and diarrhea. Genitourinary: Negative for dysuria. Musculoskeletal: Negative for back pain. LLE  pain & swelling as above Skin: Negative for rash. Neurological: Negative for headaches, focal weakness or numbness. ____________________________________________  PHYSICAL EXAM:  VITAL SIGNS: ED Triage Vitals [05/10/20 1558]  Enc Vitals Group     BP (!) 131/100     Pulse Rate 95     Resp 18     Temp 97.7 F (36.5 C)     Temp Source Oral     SpO2 100 %     Weight  193 lb (87.5 kg)     Height 5\' 5"  (1.651 m)     Head Circumference      Peak Flow      Pain Score 10     Pain Loc      Pain Edu?      Excl. in [place]?    Constitutional: Alert and oriented. Well appearing and in no distress. Head: Normocephalic and atraumatic. Eyes: Conjunctivae are normal. Normal extraocular movements Cardiovascular: Normal rate, regular rhythm. Normal distal pulses.  Left lower extremity with ecchymosis noted to the medial ankle at the Achilles fossa. There is also some generalized swelling from the dorsum of the foot to the pretibial region.  Patient is mildly tender to palpation of the same area.  No significant calf or Achilles tenderness is elicited. Respiratory: Normal respiratory effort. No wheezes/rales/rhonchi. Musculoskeletal: Normal range of motion to the left knee and left ankle.  No internal derangement suspected.  Nontender with normal range of motion in all extremities.  Neurologic: Mildly antalgic number gait without ataxia. Normal speech and language. No gross focal neurologic deficits are appreciated. Skin:  Skin is warm, dry and intact. No rash noted. Psychiatric: Mood and affect are normal. Patient exhibits appropriate insight and judgment. ____________________________________________   LABS (pertinent positives/negatives) Labs Reviewed - No data to display ____________________________________________   RADIOLOGY  US Venous LLE  IMPRESSION: Positive exam for presence of deep venous thrombosis in the LEFT calf involving the LEFT peroneal and posterior tibial veins.  No evidence of above knee deep venous thrombosis in the LEFT lower extremity. ____________________________________________  PROCEDURES  Procedures ____________________________________________  INITIAL IMPRESSION / ASSESSMENT AND PLAN / ED COURSE  ----------------------------------------- 5:12 PM on 05/10/2020 ----------------------------------------- Patient had to leave the  ED abruptly, to pick up her infant child from the daycare center.  She reports that she is frustrated because she spent the majority of time at the urgent care, before they were able to verify her insurance.  She intends to return to the ED as discussed for further evaluation management of her symptoms.  She understands that her DVT study is incomplete at this time, as results not available at the time of this disposition.  Patient to return to the ED immediately for further evaluation management of her complaint.  ----------------------------------------- 5:23 PM on 05/10/2020 ----------------------------------------- Patient notified of the unofficial US findings consistent with non-occlusive thrombi of the left calf. She is aware that a prescription for Eliquis will be sent to her pharmacy. She will follow-up with her provider or return as needed.   Amber Vance was evaluated in Emergency Department on 05/10/2020 for the symptoms described in the history of present illness. She was evaluated in the context of the global COVID-19 pandemic, which necessitated consideration that the patient might be at risk for infection with the SARS-CoV-2 virus that causes COVID-19. Institutional protocols and algorithms that pertain to the evaluation of patients at risk for COVID-19 are in a state of rapid change based on information released by regulatory bodies  including the CDC and federal and state organizations. These policies and algorithms were followed during the patient's care in the ED. ____________________________________________  FINAL CLINICAL IMPRESSION(S) / ED DIAGNOSES  Final diagnoses:  Left leg pain  Acute deep vein thrombosis (DVT) of calf muscle vein of left lower extremity ([place])      Carmie End, Dannielle Karvonen, PA-C 05/10/20 1814    Vanessa Bellefonte, MD 05/10/20 1902

## 2020-05-10 NOTE — Telephone Encounter (Signed)
Called pt about her medicaid is out of network with our office and she does have an appt 7.23.2021...  She is calling her medicaid person and getting it changed. I did tell pt the 3 we are in network with. [employer] and [employer] medicaid.

## 2020-05-10 NOTE — ED Notes (Signed)
See triage note  presents with pain and swelling to left lower leg  States sxs' started about 1 week ago  Denies any injury  Leg warm to touch

## 2020-05-13 ENCOUNTER — Encounter: Payer: Self-pay | Admitting: Podiatry

## 2020-05-13 ENCOUNTER — Other Ambulatory Visit: Payer: Self-pay

## 2020-05-13 ENCOUNTER — Ambulatory Visit: Payer: Medicaid Other | Admitting: Podiatry

## 2020-05-13 DIAGNOSIS — D492 Neoplasm of unspecified behavior of bone, soft tissue, and skin: Secondary | ICD-10-CM

## 2020-05-13 DIAGNOSIS — D3610 Benign neoplasm of peripheral nerves and autonomic nervous system, unspecified: Secondary | ICD-10-CM

## 2020-05-13 DIAGNOSIS — M722 Plantar fascial fibromatosis: Secondary | ICD-10-CM

## 2020-05-14 ENCOUNTER — Encounter: Payer: Self-pay | Admitting: Podiatry

## 2020-05-14 NOTE — Progress Notes (Signed)
Subjective:  Patient ID: Amber Vance, female    DOB: June 24, 1986,  MRN: [id number]  Chief Complaint  Patient presents with  . Plantar Fasciitis    My left foot is really hurting again at my toes and feels numb and tingly.  My right foot feels ok now"    34 y.o. female presents with the above complaint.  Patient presents with a follow-up of left first interspace neuroma that is doing a lot better.  Patient states that it is hurting a little bit but she is overall doing better however she has a secondary complaint of heel pain that has progressive gotten worse.  Patient states it feels similar to the right plantar fasciitis.  She would like to know if she can get injection to the left side.  She has been working a lot with her new job.  She denies any other acute complaints.   Review of Systems: Negative except as noted in the HPI. Denies N/V/F/Ch.  Past Medical History:  Diagnosis Date  . Anxiety   . Back pain   . Depression     Current Outpatient Medications:  .  APIXABAN (ELIQUIS) VTE STARTER PACK (10MG  AND 5MG ), Take as directed on package: start with two-5mg  tablets twice daily for 7 days. On day 8, switch to one-5mg  tablet twice daily., Disp: 1 each, Rfl: 0 .  baclofen (LIORESAL) 10 MG tablet, Take 10 mg by mouth 3 (three) times daily., Disp: , Rfl:  .  gabapentin (NEURONTIN) 800 MG tablet, Take 800 mg by mouth 3 (three) times daily., Disp: , Rfl:  .  hydrOXYzine (ATARAX/VISTARIL) 50 MG tablet, Take 50 mg by mouth 3 (three) times daily as needed., Disp: , Rfl:  .  medroxyPROGESTERone (DEPO-PROVERA) 150 MG/ML injection, Inject into the muscle., Disp: , Rfl:  .  meloxicam (MOBIC) 15 MG tablet, Take 15 mg by mouth daily., Disp: , Rfl:  .  pregabalin (LYRICA) 100 MG capsule, Take 100 mg by mouth 2 (two) times daily., Disp: , Rfl:  .  Vitamin D, Ergocalciferol, (DRISDOL) 1.25 MG (50000 UNIT) CAPS capsule, Take 50,000 Units by mouth once a week., Disp: , Rfl:   Social History    Tobacco Use  Smoking Status Current Every Day Smoker  . Packs/day: 0.50  . Types: Cigarettes  Smokeless Tobacco Never Used    Allergies  Allergen Reactions  . Duloxetine Swelling    Swollen eyelids   Objective:  There were no vitals filed for this visit. There is no height or weight on file to calculate BMI. Constitutional Well developed. Well nourished.  Vascular Dorsalis pedis pulses palpable bilaterally. Posterior tibial pulses palpable bilaterally. Capillary refill normal to all digits.  No cyanosis or clubbing noted. Pedal hair growth normal.  Neurologic Normal speech. Oriented to person, place, and time. Epicritic sensation to light touch grossly present bilaterally.  Dermatologic Nails well groomed and normal in appearance. No open wounds. No skin lesions.  Orthopedic:  Mild pain on palpation to the right second metatarsophalangeal joint.  Pain with range of motion.  No extensor or flexor pain noted with range of motion.  Negative Tinel's sign.  No neuromas noted.  No Mulder's click no pain on palpation to the first interspace.  Pain on palpation to the calcaneal tuber of the left origin of the plantar fascia.  Pain with dorsiflexion of all the digits at the origin of plantar fascia.  Positive Silfverskiold test with gastrocnemius equinus noted.       Radiographs: 3  views of skeletally mature adult right foot.  No stress fractures noted.  No periosteal reactions noted.  There is a bunion deformity present with sesamoid position of 6 out of 7.  There is hallux valgus deformity.  There is increase in intermetatarsal angle.  No hammertoe contractures noted.  No arthritic changes noted. Assessment:   1. Plantar fasciitis of left foot   2. Neuroma    Plan:  Patient was evaluated and treated and all questions answered.   Plantar Fasciitis, left - XR reviewed as above.  - Educated on icing and stretching. Instructions given.  - Injection delivered to the plantar  fascia as below. - DME: None - Pharmacologic management: None  Procedure: Injection Tendon/Ligament Location: Left plantar fascia at the glabrous junction; medial approach. Skin Prep: alcohol Injectate: 0.5 cc 0.5% marcaine plain, 0.5 cc of 1% Lidocaine, 0.5 cc kenalog 10. Disposition: Patient tolerated procedure well. Injection site dressed with a band-aid.  No follow-ups on file.  Right peroneal tendinitis -I explained to patient the etiology of peroneal tendinitis and various treatment options were extensively discussed.  We discussed with her that she is very aggressive on her foot and it seems to be aggravating various tendon pathology.  I believe she will benefit from a steroid injection help decrease the acute inflammatory component associated with pain.  Patient states understanding would like purchasing with the injection.  I explained to the patient that given that this near the tendon and the tendon sheath there is a high risk of rupture.  Patient still wishes to give the injection. -A steroid injection was performed at right peroneal tendinitis using 1% plain Lidocaine and 10 mg of Kenalog. This was well tolerated. -If there is no resolve meant of pain we will consider getting an MRI of as well as a cam boot immobilization.  Patient agrees with plan    Right plantar fasciitis -Resolved with a steroid injection and plantar fascial brace.  Right second metatarsophalangeal joint capsulitis -Resolving with a steroid injection.  I will hold off on any further injection as the pain has resolved. -Metatarsal pads are helping.  Left first interspace neuroma -Clinically got little bit worse however patient has been managing with good shoe gear modification.  We will hold off on treating it for now.  Metatarsal pads were dispensed to continue help with the first interspace neuroma.  Pes planus semiflexible -I explained to the patient the etiology of pes planus deformity and various  treatment options were discussed extensively.  Given the pain that she is having in the right second metatarsophalangeal joint as well as left first interspace I believe she will benefit from custom-made orthotics to help support the arch of the foot as well as control the hindfoot motion.  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  I believe good arch support will definitely help with this. -I will discuss with her about custom-made orthotics in the future.   No follow-ups on file.

## 2020-07-17 ENCOUNTER — Emergency Department: Payer: Medicaid Other

## 2020-07-17 ENCOUNTER — Other Ambulatory Visit: Payer: Self-pay

## 2020-07-17 ENCOUNTER — Emergency Department
Admission: EM | Admit: 2020-07-17 | Discharge: 2020-07-17 | Disposition: A | Discharge status: Other Acute Inpatient Hospital | Payer: Medicaid Other | Attending: Emergency Medicine | Admitting: Emergency Medicine

## 2020-07-17 DIAGNOSIS — Z20822 Contact with and (suspected) exposure to covid-19: Secondary | ICD-10-CM | POA: Diagnosis not present

## 2020-07-17 DIAGNOSIS — R4182 Altered mental status, unspecified: Secondary | ICD-10-CM | POA: Diagnosis present

## 2020-07-17 DIAGNOSIS — F17210 Nicotine dependence, cigarettes, uncomplicated: Secondary | ICD-10-CM | POA: Diagnosis not present

## 2020-07-17 LAB — CBC WITH DIFFERENTIAL/PLATELET
Abs Immature Granulocytes: 0.07 10*3/uL (ref 0.00–0.07)
Basophils Absolute: 0.1 10*3/uL (ref 0.0–0.1)
Basophils Relative: 1 %
Eosinophils Absolute: 0.3 10*3/uL (ref 0.0–0.5)
Eosinophils Relative: 2 %
HCT: 42.4 % (ref 36.0–46.0)
Hemoglobin: 14.4 g/dL (ref 12.0–15.0)
Immature Granulocytes: 1 %
Lymphocytes Relative: 23 %
Lymphs Abs: 3.2 10*3/uL (ref 0.7–4.0)
MCH: 29.8 pg (ref 26.0–34.0)
MCHC: 34 g/dL (ref 30.0–36.0)
MCV: 87.8 fL (ref 80.0–100.0)
Monocytes Absolute: 0.7 10*3/uL (ref 0.1–1.0)
Monocytes Relative: 5 %
Neutro Abs: 9.3 10*3/uL — ABNORMAL HIGH (ref 1.7–7.7)
Neutrophils Relative %: 68 %
Platelets: 312 10*3/uL (ref 150–400)
RBC: 4.83 MIL/uL (ref 3.87–5.11)
RDW: 13.9 % (ref 11.5–15.5)
WBC: 13.6 10*3/uL — ABNORMAL HIGH (ref 4.0–10.5)
nRBC: 0 % (ref 0.0–0.2)

## 2020-07-17 LAB — COMPREHENSIVE METABOLIC PANEL
ALT: 13 U/L (ref 0–44)
AST: 23 U/L (ref 15–41)
Albumin: 3.6 g/dL (ref 3.5–5.0)
Alkaline Phosphatase: 92 U/L (ref 38–126)
Anion gap: 11 (ref 5–15)
BUN: 6 mg/dL (ref 6–20)
CO2: 21 mmol/L — ABNORMAL LOW (ref 22–32)
Calcium: 8.8 mg/dL — ABNORMAL LOW (ref 8.9–10.3)
Chloride: 104 mmol/L (ref 98–111)
Creatinine, Ser: 0.77 mg/dL (ref 0.44–1.00)
GFR calc Af Amer: >60 mL/min (ref >60)
GFR calc non Af Amer: >60 mL/min (ref >60)
Glucose, Bld: 100 mg/dL — ABNORMAL HIGH (ref 70–99)
Potassium: 3.6 mmol/L (ref 3.5–5.1)
Sodium: 136 mmol/L (ref 135–145)
Total Bilirubin: 0.5 mg/dL (ref 0.3–1.2)
Total Protein: 7.2 g/dL (ref 6.5–8.1)

## 2020-07-17 LAB — URINE DRUG SCREEN, QUALITATIVE (ARMC ONLY)
Amphetamines, Ur Screen: NOT DETECTED
Barbiturates, Ur Screen: NOT DETECTED
Benzodiazepine, Ur Scrn: NOT DETECTED
Cannabinoid 50 Ng, Ur ~~LOC~~: NOT DETECTED
Cocaine Metabolite,Ur ~~LOC~~: NOT DETECTED
MDMA (Ecstasy)Ur Screen: NOT DETECTED
Methadone Scn, Ur: NOT DETECTED
Opiate, Ur Screen: NOT DETECTED
Phencyclidine (PCP) Ur S: NOT DETECTED
Tricyclic, Ur Screen: NOT DETECTED

## 2020-07-17 LAB — URINALYSIS, COMPLETE (UACMP) WITH MICROSCOPIC
Bacteria, UA: NONE SEEN
Bilirubin Urine: NEGATIVE
Glucose, UA: NEGATIVE mg/dL
Ketones, ur: NEGATIVE mg/dL
Leukocytes,Ua: NEGATIVE
Nitrite: NEGATIVE
Protein, ur: NEGATIVE mg/dL
Specific Gravity, Urine: 1.009 (ref 1.005–1.030)
pH: 7 (ref 5.0–8.0)

## 2020-07-17 LAB — RESPIRATORY PANEL BY RT PCR (FLU A&B, COVID)
Influenza A by PCR: NEGATIVE
Influenza B by PCR: NEGATIVE
SARS Coronavirus 2 by RT PCR: NEGATIVE

## 2020-07-17 LAB — AMMONIA: Ammonia: 16 umol/L (ref 9–35)

## 2020-07-17 LAB — GLUCOSE, CAPILLARY: Glucose-Capillary: 91 mg/dL (ref 70–99)

## 2020-07-17 LAB — ETHANOL: Alcohol, Ethyl (B): <10 mg/dL (ref <10)

## 2020-07-17 MED ORDER — PROPOFOL 1000 MG/100ML IV EMUL
INTRAVENOUS | Status: AC
  Administered 2020-07-17: 10 ug/kg/min via INTRAVENOUS
  Filled 2020-07-17: qty 100

## 2020-07-17 MED ORDER — ETOMIDATE 2 MG/ML IV SOLN
20.0000 mg | Freq: Once | INTRAVENOUS | Status: AC
  Administered 2020-07-17: 20 mg via INTRAVENOUS

## 2020-07-17 MED ORDER — ONDANSETRON HCL 4 MG/2ML IJ SOLN: 4.0000 mg | Freq: Once | INTRAMUSCULAR | Status: AC

## 2020-07-17 MED ORDER — ONDANSETRON HCL 4 MG/2ML IJ SOLN
INTRAMUSCULAR | Status: AC
  Administered 2020-07-17: 4 mg via INTRAVENOUS
  Filled 2020-07-17: qty 2

## 2020-07-17 MED ORDER — ROCURONIUM BROMIDE 50 MG/5ML IV SOLN
80.0000 mg | Freq: Once | INTRAVENOUS | Status: AC
  Administered 2020-07-17: 80 mg via INTRAVENOUS
  Filled 2020-07-17: qty 8

## 2020-07-17 MED ORDER — LORAZEPAM 2 MG/ML IJ SOLN
1.0000 mg | Freq: Once | INTRAMUSCULAR | Status: AC
  Administered 2020-07-17: 1 mg via INTRAVENOUS
  Filled 2020-07-17: qty 1

## 2020-07-17 MED ORDER — SODIUM CHLORIDE 0.9 % IV BOLUS
1000.0000 mL | Freq: Once | INTRAVENOUS | Status: AC
  Administered 2020-07-17: 1000 mL via INTRAVENOUS

## 2020-07-17 MED ORDER — IOHEXOL 300 MG/ML  SOLN: 75.0000 mL | Freq: Once | INTRAMUSCULAR | Status: DC | PRN

## 2020-07-17 MED ORDER — PROPOFOL 1000 MG/100ML IV EMUL: 5.0000 ug/kg/min | INTRAVENOUS | Status: DC

## 2020-07-17 MED ORDER — IOHEXOL 350 MG/ML SOLN
75.0000 mL | Freq: Once | INTRAVENOUS | Status: AC | PRN
  Administered 2020-07-17: 75 mL via INTRAVENOUS

## 2020-07-17 MED ORDER — LORAZEPAM 2 MG/ML IJ SOLN
INTRAMUSCULAR | Status: AC
  Administered 2020-07-17: 0.5 mg via INTRAVENOUS
  Filled 2020-07-17: qty 1

## 2020-07-17 MED ORDER — LORAZEPAM 2 MG/ML IJ SOLN: 0.5000 mg | Freq: Once | INTRAMUSCULAR | Status: AC

## 2020-07-17 NOTE — ED Notes (Signed)
Pt successfully intubated at this time.  23 at the lip on R side. Confirmed by color change and ausculation.

## 2020-07-17 NOTE — ED Notes (Signed)
Unable to sign for transfer due to intubation.

## 2020-07-17 NOTE — ED Notes (Signed)
Md and RT at bedside.

## 2020-07-17 NOTE — ED Provider Notes (Signed)
[facility] Emergency Department Provider Note   ____________________________________________   First MD Initiated Contact with Patient 07/17/20 1401     (approximate)  I have reviewed the triage vital signs and the nursing notes.   HISTORY  Chief Complaint Altered Mental Status    HPI Amber Vance is a 34 y.o. female with a past medical history of depression and chronic back pain who presents via EMS due to altered mental status.  EMS states that patient's 52-year-old daughter called due to patient being somnolent and not responding to her.  EMS states patient's medications as gabapentin, hydroxyzine, escitalopram, and metronidazole.  Unknown as to whether there were any medications missing.  EMS noted patient to be hypotensive with systolics in the 82U.  Patient arrives somnolent but moaning and attempting to move.  Further history and review of systems unable to assess at this time.         Past Medical History:  Diagnosis Date  . Anxiety   . Back pain   . Depression     Patient Active Problem List   Diagnosis Date Noted  . Hav (hallux abducto valgus), left 12/25/2018  . [redacted] weeks gestation of pregnancy 07/19/2018  . HPV (human papilloma virus) anogenital infection 06/14/2018  . Tobacco smoking affecting pregnancy 06/14/2018  . Supervision of high risk pregnancy in second trimester 02/26/2018  . Lumbar radiculopathy 02/09/2017  . Scoliosis of thoracolumbar spine 02/09/2017  . Anemia due to vitamin B12 deficiency 06/07/2016  . Paresthesia 05/16/2016  . Thyroid nodule 05/16/2016  . Anxiety and depression 03/16/2015  . Fibromyalgia 02/08/2015  . Galactorrhea 08/26/2013  . Anxiety 02/04/2013  . STD (female) 02/04/2013  . Tobacco use 02/04/2013    No past surgical history on file.  Prior to Admission medications   Medication Sig Start Date End Date Taking? Authorizing Provider  APIXABAN Arne Cleveland) VTE STARTER PACK (10MG  AND 5MG ) Take  as directed on package: start with two-5mg  tablets twice daily for 7 days. On day 8, switch to one-5mg  tablet twice daily. 05/10/20   Menshew, Dannielle Karvonen, PA-C  baclofen (LIORESAL) 10 MG tablet Take 10 mg by mouth 3 (three) times daily.    [provider]  gabapentin (NEURONTIN) 800 MG tablet Take 800 mg by mouth 3 (three) times daily. 12/19/19   [provider]  hydrOXYzine (ATARAX/VISTARIL) 50 MG tablet Take 50 mg by mouth 3 (three) times daily as needed.    [provider]  medroxyPROGESTERone (DEPO-PROVERA) 150 MG/ML injection Inject into the muscle. 03/21/19   [provider]  meloxicam (MOBIC) 15 MG tablet Take 15 mg by mouth daily.    [provider]  pregabalin (LYRICA) 100 MG capsule Take 100 mg by mouth 2 (two) times daily.    [provider]  Vitamin D, Ergocalciferol, (DRISDOL) 1.25 MG (50000 UNIT) CAPS capsule Take 50,000 Units by mouth once a week. 10/22/19   [provider]    Allergies Duloxetine  No family history on file.  Social History Social History   Tobacco Use  . Smoking status: Current Every Day Smoker    Packs/day: 0.50    Types: Cigarettes  . Smokeless tobacco: Never Used  Substance Use Topics  . Alcohol use: Yes  . Drug use: No    Review of Systems Unable to assess   ____________________________________________   PHYSICAL EXAM:  VITAL SIGNS: ED Triage Vitals  Enc Vitals Group     BP 07/17/20 1400 101/84  Pulse Rate 07/17/20 1400 71     Resp --      Temp 07/17/20 1400 (!) 96.5 F (35.8 C)     Temp Source 07/17/20 1400 Axillary     SpO2 07/17/20 1400 93 %     Weight 07/17/20 1358 190 lb (86.2 kg)     Height 07/17/20 1358 5\' 5"  (1.651 m)     Head Circumference --      Peak Flow --      Pain Score --      Pain Loc --      Pain Edu? --      Excl. in [place]? --     Constitutional: Somnolent and moaning.  Obese Eyes: Conjunctivae are normal. PERRL. Head: Atraumatic. Nose: No  congestion/rhinnorhea. Mouth/Throat: Mucous membranes are moist. Neck: No stridor Cardiovascular: Normal rate, regular rhythm. Grossly normal heart sounds.  Good peripheral circulation. Respiratory: Normal respiratory effort.  No retractions. Gastrointestinal: Soft and nontender. No distention. Musculoskeletal: No lower extremity tenderness nor edema.  No joint effusions. Neurologic: Left facial droop.  Right-sided hemineglect Skin:  Skin is warm and dry. No rash noted.  ____________________________________________   LABS (all labs ordered are listed, but only abnormal results are displayed)  Labs Reviewed  COMPREHENSIVE METABOLIC PANEL - Abnormal; Notable for the following components:      Result Value   CO2 21 (*)    Glucose, Bld 100 (*)    Calcium 8.8 (*)    All other components within normal limits  CBC WITH DIFFERENTIAL/PLATELET - Abnormal; Notable for the following components:   WBC 13.6 (*)    Neutro Abs 9.3 (*)    All other components within normal limits  GLUCOSE, CAPILLARY  URINALYSIS, COMPLETE (UACMP) WITH MICROSCOPIC  ETHANOL  URINE DRUG SCREEN, QUALITATIVE ([facility] ONLY)  AMMONIA  PREGNANCY, URINE  SALICYLATE LEVEL  ACETAMINOPHEN LEVEL  CBG MONITORING, ED  POC URINE PREG, ED   ____________________________________________  EKG  ED ECG REPORT I, Naaman Plummer, the attending physician, personally viewed and interpreted this ECG.  Date: 07/17/2020 EKG Time: 1404 Rate: 72 Rhythm: normal sinus rhythm QRS Axis: normal Intervals: normal ST/T Wave abnormalities: normal Narrative Interpretation: no evidence of acute ischemia  ____________________________________________  RADIOLOGY  Pending at the end of my shift ____________________________________________   PROCEDURES  Procedure(s) performed (including Critical Care):  Procedures   ____________________________________________   INITIAL IMPRESSION / ASSESSMENT AND PLAN / ED COURSE  As part of  my medical decision making, I reviewed the following data within the [place] notes reviewed and incorporated, Labs reviewed, EKG interpreted, Old chart reviewed, Radiograph reviewed and Notes from prior ED visits reviewed and incorporated        Patient is a 34 year old female brought in by EMS after being found minimally responsive by her daughter at home.  EMS noted patient to not be responding to her name but moving spontaneously and moaning in route  Differential diagnosis includes: Medication overdose, unintentional overdose, intracerebral hemorrhage, sepsis, meningitis, metabolic encephalopathy  Work-up: Labs Urine Head CT  Dispo: Laboratory radiologic evaluation pending at the end of my shift and patient will be turned over to the oncoming physician.  All pertinent patient formation was conveyed and all questions were answered.  All further care and disposition decisions were made by the oncoming physician.      ____________________________________________   FINAL CLINICAL IMPRESSION(S) / ED DIAGNOSES  Final diagnoses:  None     ED Discharge Orders  None       Note:  This document was prepared using Dragon voice recognition software and may include unintentional dictation errors.   Naaman Plummer, MD 07/17/20 [phone]

## 2020-07-17 NOTE — ED Provider Notes (Signed)
----------------------------------------- 3:03 PM on 07/17/2020 -----------------------------------------  Blood pressure (!) 150/96, pulse 68, temperature (!) 96.5 F (35.8 C), temperature source Axillary, resp. rate (!) 25, height _0  (1.651 m), weight 86.2 kg, SpO2 94 %.  Assuming care from Dr. Cheri Fowler.  In short, Amber Vance is a 34 y.o. female with a chief complaint of Altered Mental Status .  Refer to the original H&P for additional details.  The current plan of care is to follow-up CT and imaging results for AMS.  ----------------------------------------- 4:52 PM on 07/17/2020 -----------------------------------------  Patient noted to have abnormal CT findings with possible temporal fluid collection, although CT images are severely limited by motion artifact.  Case discussed with neurosurgery, who recommends further assessment with MRI brain and MRV given her recent DVT and concern for venous sinus thrombosis.  On reassessment, patient agitated and vomiting, continues to appear to have right-sided neglect but localizes to pain on the left.  Given these findings, patient with GCS of 9 and we will have a low threshold for intubation.  In further discussion with Dr. Catalina Pizza, we will discuss with neuro intensivist at [facility] for potential transfer due to concern for increased intracranial pressure as well as abnormal findings on CT head.  Case discussed with neuro intensivist at [facility], Dr. Manuella Ghazi, who accepts patient for transfer and recommends further assessment with CT venogram as well as CTA head and neck.  Patient increasingly agitated with attempt of CT scan and given concern for her airway protection during transport with ongoing vomiting, will plan for intubation of patient and reattempt imaging prior to transfer.  ----------------------------------------- 5:42 PM on 07/17/2020 -----------------------------------------  Patient was unable to remain still enough for CT  venogram as well as CT angiogram, and given ongoing concerns for airway protection, patient was intubated.  She was intubated without difficulty and follow-up chest x-ray shows ET tube just above the carina.  ET tube was subsequently retracted 1 cm to 23 at the lip, we will now perform CT venogram as well as CT angiogram.  [facility] at the bedside for transfer to the [facility] ED after imaging obtained.   Procedure Name: Intubation Date/Time: 07/17/2020 5:40 PM Performed by: Blake Divine, MD Pre-anesthesia Checklist: Patient identified, Patient being monitored, Emergency Drugs available, Timeout performed and Suction available Oxygen Delivery Method: Non-rebreather mask Preoxygenation: Pre-oxygenation with 100% oxygen Induction Type: Rapid sequence Ventilation: Mask ventilation without difficulty Laryngoscope Size: Mac and 4 Grade View: Grade I Tube size: 7.5 mm Number of attempts: 1 Airway Equipment and Method: Video-laryngoscopy Placement Confirmation: ETT inserted through vocal cords under direct vision,  CO2 detector and Breath sounds checked- equal and bilateral Secured at: 24 cm Tube secured with: ETT holder Dental Injury: Teeth and Oropharynx as per pre-operative assessment     .Critical Care Performed by: Blake Divine, MD Authorized by: Blake Divine, MD   Critical care provider statement:    Critical care time (minutes):  75   Critical care time was exclusive of:  Separately billable procedures and treating other patients and teaching time   Critical care was necessary to treat or prevent imminent or life-threatening deterioration of the following conditions:  CNS failure or compromise   Critical care was time spent personally by me on the following activities:  Discussions with consultants, evaluation of patient's response to treatment, examination of patient, ordering and performing treatments and interventions, ordering and review of laboratory studies, ordering  and review of radiographic studies, pulse oximetry, re-evaluation of patient's condition, obtaining history  from patient or surrogate and review of old charts   I assumed direction of critical care for this patient from another provider in my specialty: yes         Blake Divine, MD 07/17/20 2226

## 2020-07-17 NOTE — ED Triage Notes (Signed)
Pt here from home via [facility].  Pt found unresponsive by daughter. Pt responds to voice but does not follow commands.   BP 97/48. cbg 97.  Hx- pt takes gabapentin and hydroxyzine.

## 2020-07-17 NOTE — ED Notes (Signed)
D jessup at bedside and asking for assistance pt started to vomit upon his exam and unable to safely maintain her airway, pt assisted by the dr and this RN to suction and reposition to a more upright position. Ed tech assigned to sit with patient to monitor pt's safety

## 2020-07-17 NOTE — ED Notes (Signed)
Md jessup intubating pt at this time.

## 2020-07-17 NOTE — ED Notes (Signed)
This RN at bedside with pt. Pt not able to respond to this RN. Pt unable to sit still.

## 2020-07-17 NOTE — Progress Notes (Signed)
Patient transported to CT with RN on transport vent.

## 2020-07-17 NOTE — ED Notes (Signed)
Pt given ativan in Ct by this RN

## 2020-07-17 NOTE — ED Notes (Signed)
Pt back from CT

## 2020-07-17 NOTE — ED Notes (Signed)
emtala reviewed by this RN 

## 2020-10-30 IMAGING — US US EXTREM LOW VENOUS*L*
1 series · 13 of 24 positions shown · non-contrast
Comparison: None

CLINICAL DATA: Pain and swelling in LEFT lower extremity

EXAM:
LEFT LOWER EXTREMITY VENOUS DOPPLER ULTRASOUND
TECHNIQUE: Gray-scale sonography with compression, as well as color and duplex
ultrasound, were performed to evaluate the deep venous system(s)
from the level of the common femoral vein through the popliteal and
proximal calf veins.

[Series 1: us venous img lower uni left (dvt) · portal-venous · 13 of 38 slices shown]
[im 1/38]
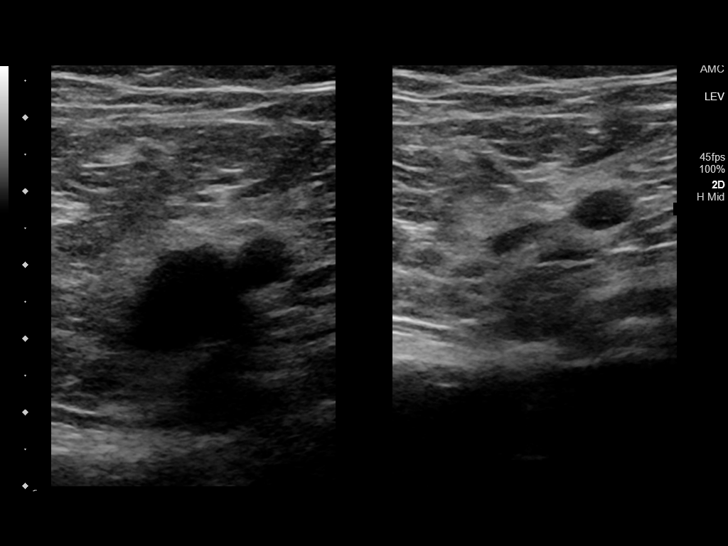
[im 4/38]
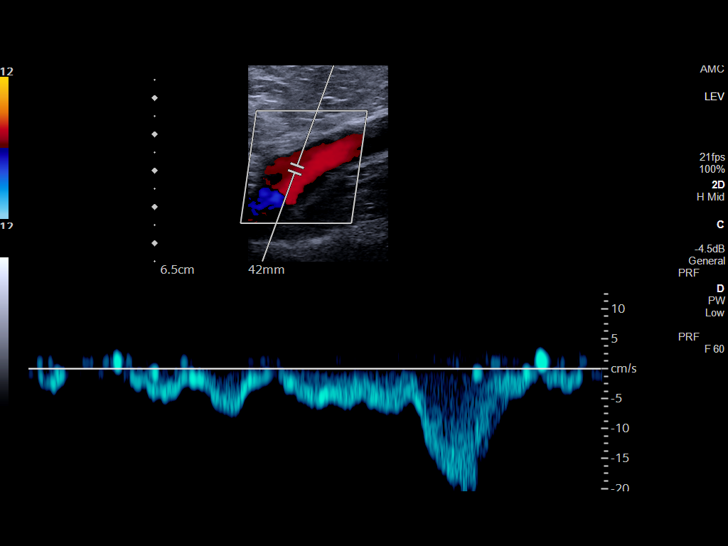
[im 7/38]
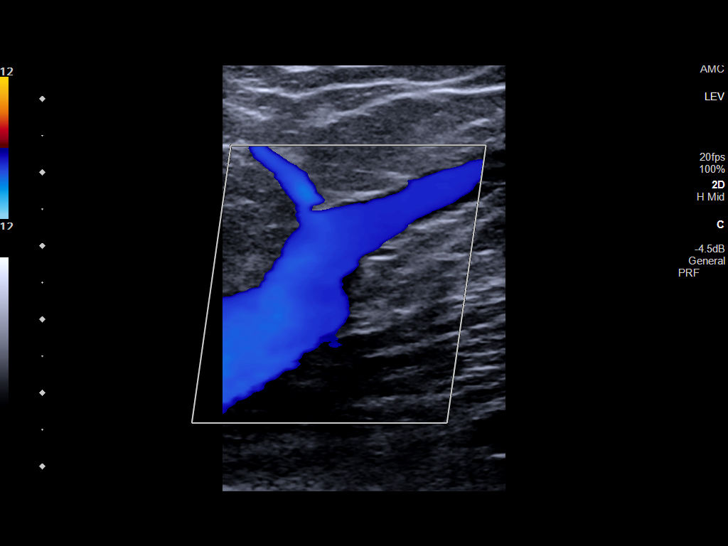
[im 10/38]
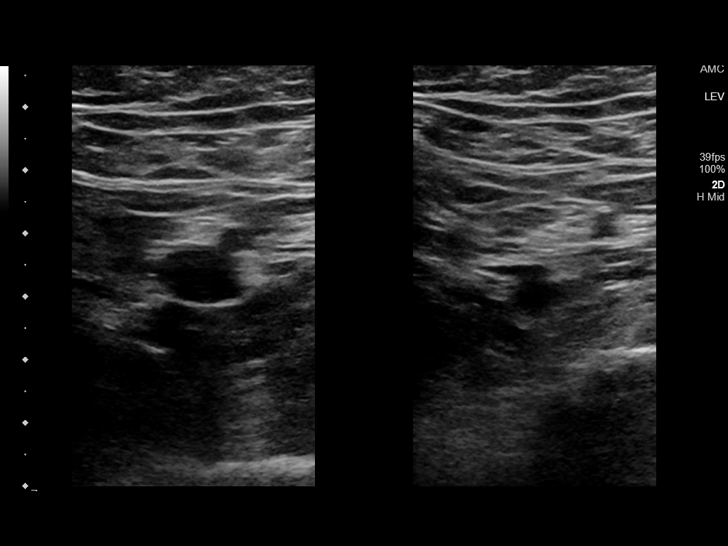
[im 13/38]
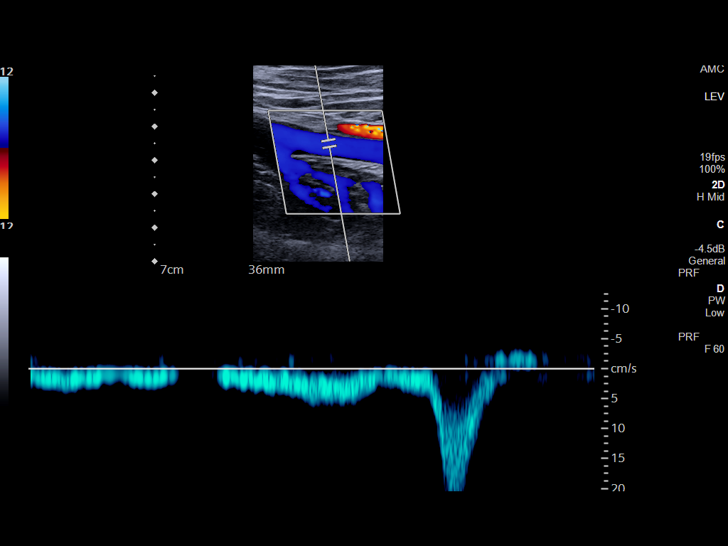
[im 17/38]
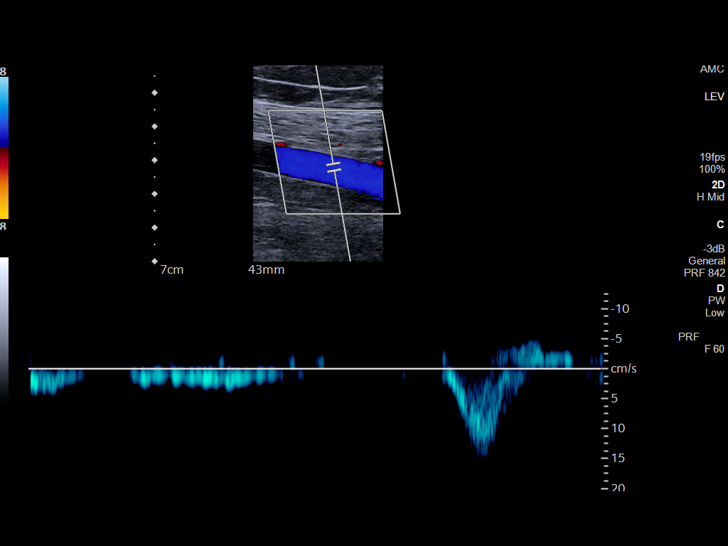
[im 20/38]
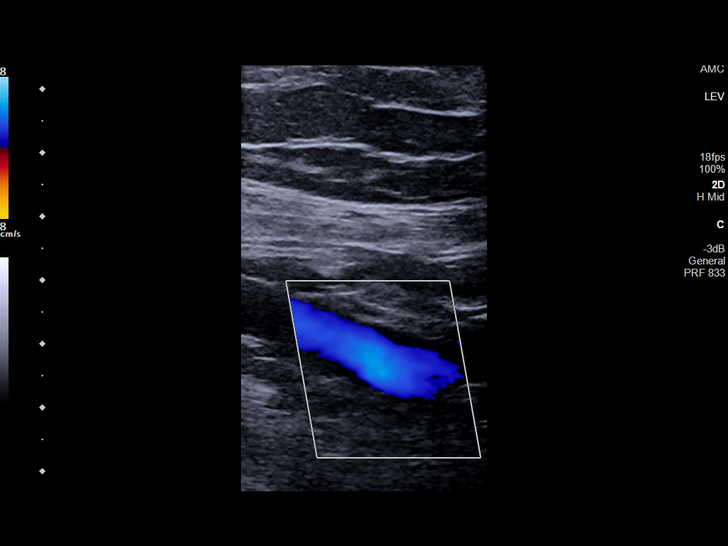
[im 21/38]
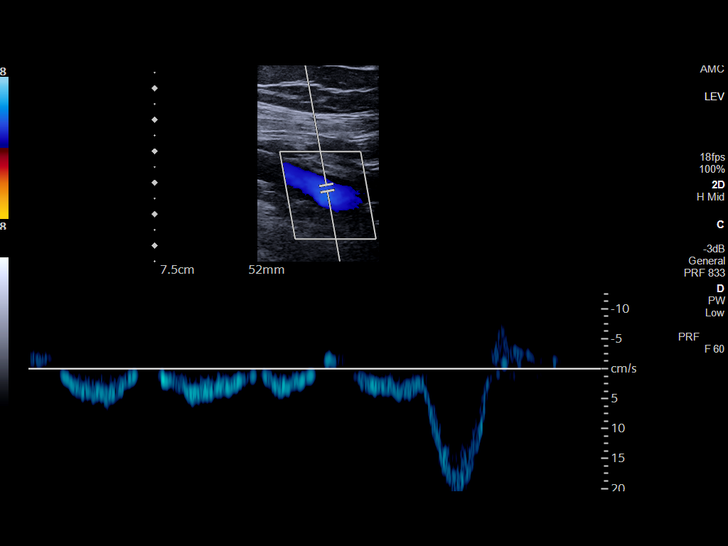
[im 25/38]
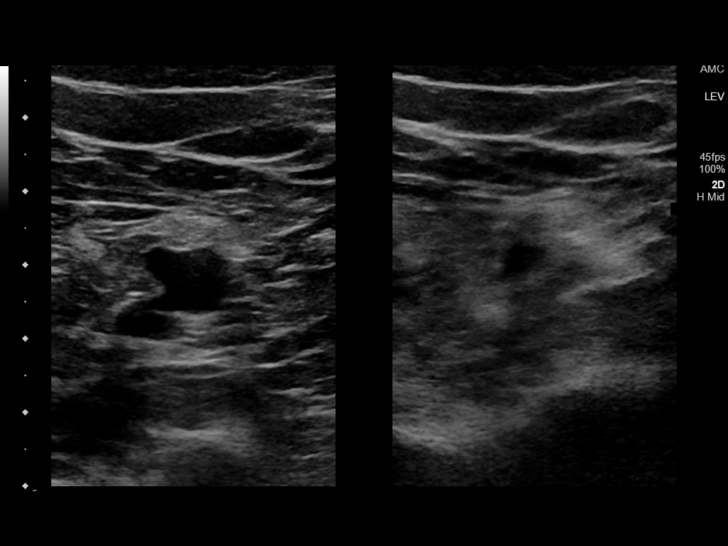
[im 28/38]
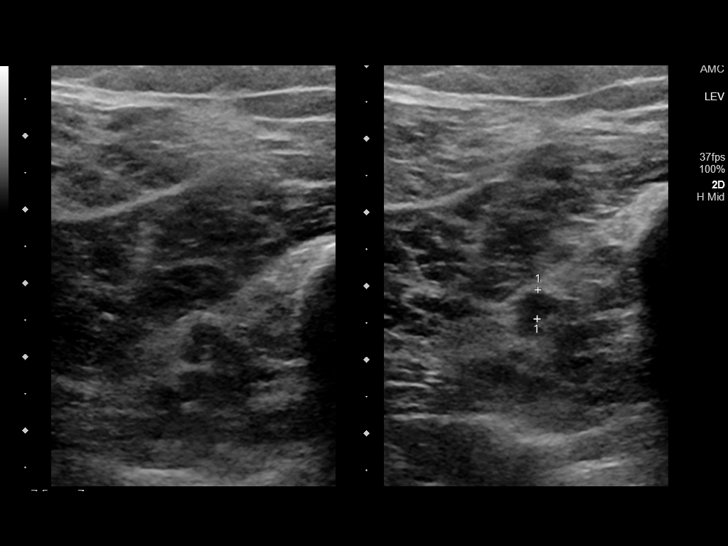
[im 31/38]
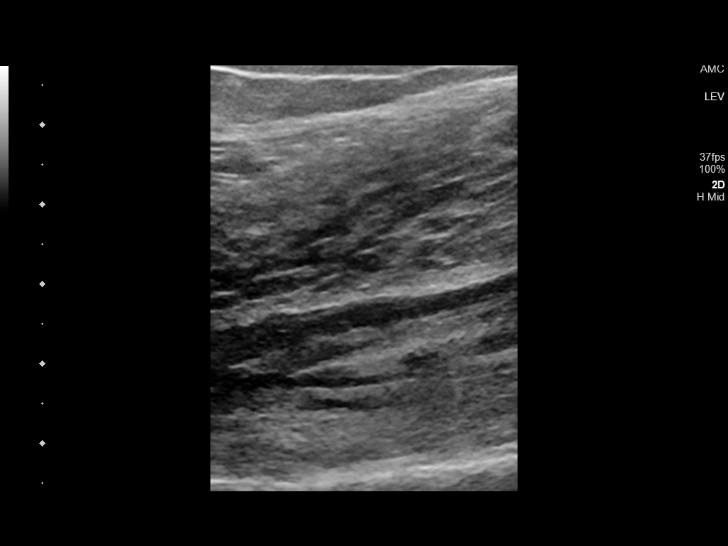
[im 34/38]
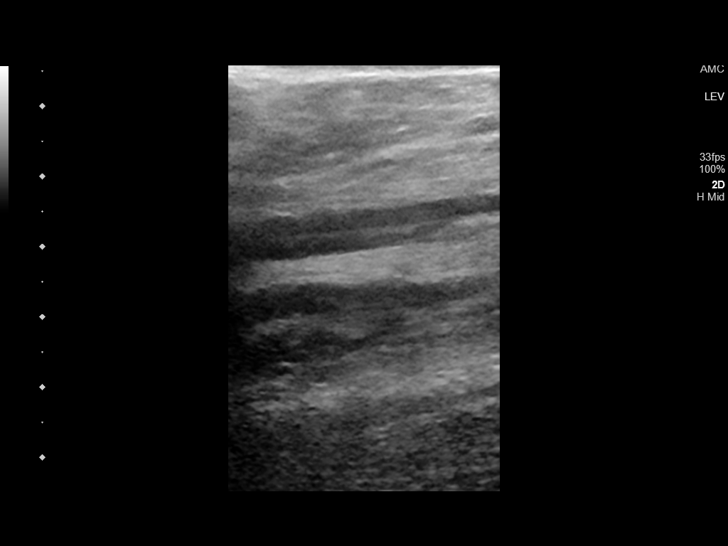
[im 38/38]
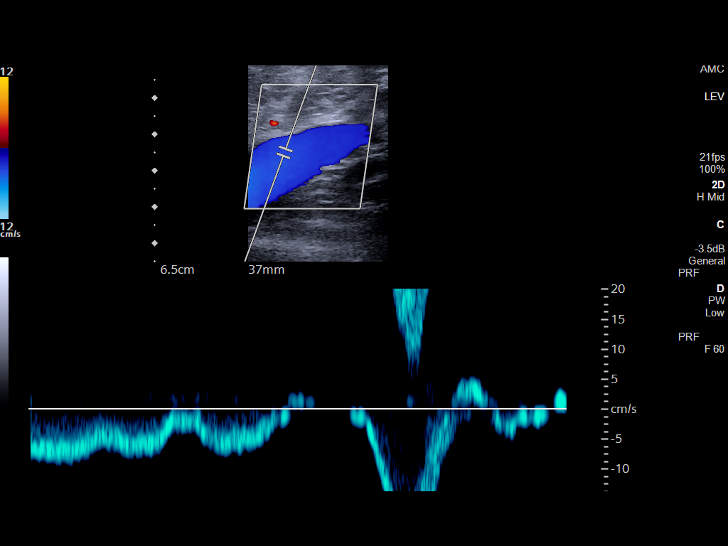

[13 of 24 positions shown; findings below may reference images not displayed]

FINDINGS: VENOUS

Normal compressibility of the common femoral, superficial femoral,
and popliteal veins. Visualized portions of profunda femoral vein
and great saphenous vein unremarkable. No filling defects to suggest
DVT on grayscale or color Doppler imaging. Doppler waveforms show
normal direction of venous flow, normal respiratory plasticity and
response to augmentation within the segments.

Assessment of the below knee deep venous system of the LEFT lower
extremity demonstrates presence of nonocclusive thrombus within the
LEFT perineal and posterior tibial veins associated with impaired
spontaneous venous flow.

Limited views of the contralateral common femoral vein are
unremarkable.

OTHER

None.

Limitations: none
IMPRESSION: Positive exam for presence of deep venous thrombosis in the LEFT
calf involving the LEFT peroneal and posterior tibial veins.

No evidence of above knee deep venous thrombosis in the LEFT lower
extremity.

## 2021-03-30 ENCOUNTER — Ambulatory Visit: Payer: Medicaid Other | Attending: Family Medicine | Admitting: Physical Therapy

## 2021-03-30 ENCOUNTER — Other Ambulatory Visit: Payer: Self-pay

## 2021-03-30 ENCOUNTER — Encounter: Payer: Self-pay | Admitting: Physical Therapy

## 2021-03-30 DIAGNOSIS — R482 Apraxia: Secondary | ICD-10-CM | POA: Insufficient documentation

## 2021-03-30 DIAGNOSIS — M6281 Muscle weakness (generalized): Secondary | ICD-10-CM

## 2021-03-30 DIAGNOSIS — R2681 Unsteadiness on feet: Secondary | ICD-10-CM | POA: Diagnosis present

## 2021-03-30 DIAGNOSIS — R4701 Aphasia: Secondary | ICD-10-CM | POA: Diagnosis present

## 2021-03-30 DIAGNOSIS — R278 Other lack of coordination: Secondary | ICD-10-CM | POA: Diagnosis present

## 2021-03-30 DIAGNOSIS — R2689 Other abnormalities of gait and mobility: Secondary | ICD-10-CM | POA: Diagnosis present

## 2021-03-30 NOTE — Therapy (Signed)
[place] [facility] SERVICES [address] [place], [place], [address] Phone: [phone]   Fax:  [phone]  Physical Therapy Evaluation  Patient Details  Name: Amber Vance MRN: [id number] Date of Birth: January 27, 1986 Referring Provider (PT): Dr. Sionne Gibraltar (PCP)/ Pearletha Forge MD (Neurologist)   Encounter Date: 03/30/2021   PT End of Session - 03/30/21 1339    Visit Number 1    Number of Visits 9    Date for PT Re-Evaluation 05/25/21    Authorization Type Medicaid    PT Start Time 1115    PT Stop Time 1230    PT Time Calculation (min) 75 min    Equipment Utilized During Treatment Gait belt    Activity Tolerance Patient tolerated treatment well;No increased pain    Behavior During Therapy WFL for tasks assessed/performed           Past Medical History:  Diagnosis Date  . Anxiety   . Back pain   . Depression     History reviewed. No pertinent surgical history.  There were no vitals filed for this visit.    Subjective Assessment - 03/30/21 1118    Subjective Patient has expressive aphasia- she has a diffiuclt time communicating. She is able to answer yes/no question.    Pertinent History Amber Vance is a 35 y.o. right handed female with a past medical history significant for smoking, DVT, and PE who  is s/p left ACA/MCA stroke s/p hemicraniectomy . She is here with her dad, Amber Vance.     She presented to emergency room on 07/17/20 with right sided weakness/numbess and LOC and fell and hit her head.  She was transferred to [facility] for further work-up and care. Her CTA showed acute left ICA/MCA occlusion as well as proximal left ACA occlusion with dissection of the proximal left ICA distal to the bifurcation with severe stenosis of the proximal left ICA. MRI showed complete infarct of the left hemisphere. Hemicraniectomy was done on 07/18/2020 and she was extubated on 07/20/2020.  Etiology of her stroke was thought to be cryptogenic but most  likely embolic in the setting of hypercoagulable state although her hypercoagulable work-up was negative. 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 On 07/20/2020, she had a IVC filter placed.  Patient was in ICU for 2 months, [facility] for 7 weeks and then transferred to SNF for additional rehab for 3 months. She has been home since 02/15/21.   Patient has 2 small children (15 yo and 2 yo). Her parents recently retired and have been helping at home.   5/11- has initial consult for cranial reconstruction, The patient has opted to proceed with elevation of the skin flap and custom cranioplasty. She is scheduled for surgery on 04/19/21.   She has expressive aphasia. She lives at home with her dad. Her dad has been active in helping care for her. She does not have any home health services at this time. She presents to therapy with sling on RUE. She is also using an AFO on RLE for ambulation. She is currently walking mostly at home with [facility].  She has falling 1-2 times while at rehab with no injury. No falls at home. She presents to therapy with helmet which she uses when she is up and walking. She  has a 37 yo and a 37 yo at home. She denies any pain; Denies numbness/tingling; Patient was going to school online at [employer] for  medical transcription and was working as a Educational psychologist at [employer]. She is currently not working and not in school.    How long can you sit comfortably? 1 hour, minimal diffiuclty;    How long can you stand comfortably? 20-30 min    How long can you walk comfortably? short distances in the home 100-200 feet with SPC    Patient Stated Goals walk better, get rid of SPC    Currently in Pain? No/denies    Multiple Pain Sites No              [facility] PT Assessment - 03/30/21 0001      Assessment   Medical Diagnosis s/p left  MCA/ACA stroke    Referring Provider (PT) Dr. Sionne Gibraltar (PCP)/ Pearletha Forge MD (Neurologist)    Onset Date/Surgical Date 07/17/20    Hand Dominance Right    Next MD Visit 04/21/21    Prior Therapy had 7 weeks of [facility], 3 months of SNF rehab; home since 02/15/21; no home health      Precautions   Precautions Other (comment)   wear helmet when up and walking   Required Braces or Orthoses Sling   RLE AFO     Restrictions   Weight Bearing Restrictions No      Balance Screen   Has the patient fallen in the past 6 months Yes    How many times? 1-2    Has the patient had a decrease in activity level because of a fear of falling?  No    Is the patient reluctant to leave their home because of a fear of falling?  No      Home Environment   Additional Comments Lives in single story home, has 1 8 inch step to enter house; able to negotiate well; has tub shower with shower seat, regular height commode; sleeping in her bed, wakes up a lot due to anxiety, reports fatigue      Prior Function   Level of Independence Independent    Vocation Other (comment)   was working at [employer] but was home due to pandemic; going to school online for medical transcription   Leisure doing puzzles, likes to spend time with family/kids;   [place] September 2022     Cognition   Overall Cognitive Status Within Functional Limits for tasks assessed      Observation/Other Assessments   Observations Very nice; Consistent with wearing helmet when standing/walking; wears sling and wrist splint on RUE    Skin Integrity grossly intact    Focus on Therapeutic Outcomes (FOTO)  60%      Sensation   Light Touch Appears Intact    Proprioception Appears Intact      Coordination   Gross Motor Movements are Fluid and Coordinated No   has increased flexor tone in RUE; has increased flexor tone in RLE   Fine Motor Movements are Fluid and Coordinated No    Heel Shin Test  accurate LLE; impaired RLE      Posture/Postural Control   Posture Comments exhibits equal shoulder height with sling; in standing, shifts to LLE;      Tone   Assessment Location Right Lower Extremity      ROM / Strength   AROM /  PROM / Strength AROM;Strength      AROM   Overall AROM Comments LUE and LLE are WFL; RUE not assessed; RLE difficulty achieving knee extension/DF due to tone/weakness      Strength   Overall Strength Comments LUE and LLE is WNL; RLE: hip 4-/5, knee 3-/5, ankle 1/5      Transfers   Comments leans to LLE with RLE advanced forward during sit<>Stand transfer; able to transfer without pusing on chair      Ambulation/Gait   Gait Comments Pt able to ambulate short distances without AD, wears RLE AFO, when walking, she ambulates with wide base of support, decreased weight shift to RLE with short stance time, decreased step length LLE with heavy step down, ambulates with minimal hip/knee flexion on RLE often circumducting to clear foot      Standardized Balance Assessment   Standardized Balance Assessment Five Times Sit to Stand;10 meter walk test    Five times sit to stand comments  17.56 sec without HHA (>10 sec indicates high fall risk)    10 Meter Walk 0.499 m/s with SPC (limited home ambulator, high fall risk)      High Level Balance   High Level Balance Comments unable to hold SLS on RLE      RLE Tone   RLE Tone Moderate;Hypertonic;Modified Ashworth      RLE Tone   Hypertonic Details increased flexor tone in knee; however increased PF noted with difficulty achieving neutral ankle position    Modified Ashworth Scale for Grading Hypertonia RLE Considerable increase in muschle tone, passive movement difficult                      Objective measurements completed on examination: See above findings.    Initiated HEP with RLE SLS with intermittent left HHA  See patient instructions;            PT Education - 03/30/21 1339     Education Details plan of care/recommendations    Person(s) Educated Patient;Parent(s)    Methods Explanation;Verbal cues;Handout    Comprehension Verbalized understanding;Returned demonstration;Verbal cues required;Need further instruction            PT Short Term Goals - 03/30/21 1347      PT SHORT TERM GOAL #1   Title Patient will be adherent to HEP daily to improve strength and mobility    Baseline does not have a  HEP    Time 4    Period Weeks    Status New    Target Date 04/27/21      PT SHORT TERM GOAL #2   Title Patient will be able to transfer sit<>Stand with feet in neutral alignment with equal weight shift between each LE.    Baseline currently shifts to LLE with RLE advanced forward and minimal weight bearing in RLE    Time 4    Period Weeks    Status New    Target Date 04/27/21      PT SHORT TERM GOAL #3   Title Patient will be able to hold SLS for 5 sec on RLE with finger tip hold or less to improve weight acceptance on RLE.    Baseline unable to achieve SLS    Time 4    Period Weeks    Status New    Target Date 04/27/21             PT Long Term Goals - 03/30/21 1349  PT LONG TERM GOAL #1   Title Patient (< 55 years old) will complete five times sit to stand test in < 10 seconds indicating an increased LE strength and improved balance.    Baseline 17.56 sec without HHA    Time 8    Period Weeks    Status New    Target Date 05/25/21      PT LONG TERM GOAL #2   Title Patient will increase 10 meter walk test to >0.8 m/s as to improve gait speed for better community ambulation and to reduce fall risk.    Baseline 0.5 m/s with SPC    Time 8    Period Weeks    Status New    Target Date 05/25/21      PT LONG TERM GOAL #3   Title Patient will increase RLE gross strength to 4+/5 in hip/knee as to improve functional strength for independent gait, increased standing tolerance and increased ADL ability.    Baseline 4-/5 to 3-/5 in RLE    Time 8     Period Weeks    Status New    Target Date 05/25/21      PT LONG TERM GOAL #4   Title Patient will improve FOTO score to >63% to indicate improved functional mobility with ADLs.    Baseline 60%    Time 8    Period Weeks    Status New    Target Date 05/25/21                  Plan - 03/30/21 1340    Clinical Impression Statement 35 yo Female presents to therapy s/p CVA on 07/17/21 with subsequent head trauma and s/p hemicraniectomy. She exhibits weakness in RUE and RLE and has expressive aphasia. Her parents are currently living with her and helping care for her and her 2 children. Patient exhibits decreased motor control and motor planning. She exhibits increased flexor tone in RUE and RLE. Patient currently ambulates with SPC and AFO on RLE. She exhibits decreased stance time on RLE with decreased step length on LLE. Patient does test as a high risk for falls with slower gait speed, decreased sit<>Stand ability and inability to hold SLS on RLE. She has had inpatient rehab at [facility] as well as SNF rehab. Patient would benefit from skilled PT Intervention to improve strength, balance and mobility. She would also benefit from referral for OT and ST to address UE weakness/incoordination and aphasia respectively.    Personal Factors and Comorbidities Comorbidity 3+;Time since onset of injury/illness/exacerbation    Comorbidities DVT/clots, anxiety/depression, former smoker    Examination-Activity Limitations Caring for Others;Locomotion Level;Squat;Stairs;Stand;Transfers    Examination-Participation Restrictions Cleaning;Community Activity;Driving;Laundry;Shop;Volunteer;Yard Work;School;Occupation    Stability/Clinical Decision Making Stable/Uncomplicated    Clinical Decision Making Low    Rehab Potential Good    PT Frequency 1x / week    PT Duration 8 weeks    PT Treatment/Interventions Cryotherapy;Moist Heat;Gait training;Stair training;Functional mobility training;Therapeutic  activities;Therapeutic exercise;Balance training;Neuromuscular re-education;Patient/family education;Orthotic Fit/Training;Passive range of motion;Energy conservation    PT Next Visit Plan advance HEP, assess balance    PT Home Exercise Plan initiated- see patient instructions    Recommended Other Services OT/ST    Consulted and Agree with Plan of Care Patient;Family member/caregiver    Family Member Consulted dad- Amber Vance           Patient will benefit from skilled therapeutic intervention in order to improve the following deficits and impairments:  Abnormal gait,Decreased balance,Decreased endurance,Decreased  mobility,Difficulty walking,Increased muscle spasms,Impaired perceived functional ability,Decreased activity tolerance,Decreased coordination,Decreased safety awareness,Decreased strength  Visit Diagnosis: Muscle weakness (generalized)  Unsteadiness on feet  Other abnormalities of gait and mobility     Problem List Patient Active Problem List   Diagnosis Date Noted  . Hav (hallux abducto valgus), left 12/25/2018  . [redacted] weeks gestation of pregnancy 07/19/2018  . HPV (human papilloma virus) anogenital infection 06/14/2018  . Tobacco smoking affecting pregnancy 06/14/2018  . Supervision of high risk pregnancy in second trimester 02/26/2018  . Lumbar radiculopathy 02/09/2017  . Scoliosis of thoracolumbar spine 02/09/2017  . Anemia due to vitamin B12 deficiency 06/07/2016  . Paresthesia 05/16/2016  . Thyroid nodule 05/16/2016  . Anxiety and depression 03/16/2015  . Fibromyalgia 02/08/2015  . Galactorrhea 08/26/2013  . Anxiety 02/04/2013  . STD (female) 02/04/2013  . Tobacco use 02/04/2013    Amber Vance PT, DPT 03/30/2021, 1:52 PM  [place] MAIN [facility] SERVICES [address] [place], [place], [address] Phone: [phone]   Fax:  [phone]  Name: Amber Vance MRN: [id number] Date of Birth: 11-11-1985

## 2021-03-30 NOTE — Patient Instructions (Signed)
Access Code: [email or web address] URL: [email or web address] Date: 03/30/2021 Prepared by: Blanche East  Exercises Standing Single Leg Stance with Counter Support - 2 x daily - 7 x weekly - 1 sets - 10 reps - 5-10 sec hold

## 2021-04-04 ENCOUNTER — Encounter: Payer: Self-pay | Admitting: Physical Therapy

## 2021-04-04 ENCOUNTER — Ambulatory Visit: Payer: Medicaid Other | Admitting: Physical Therapy

## 2021-04-04 ENCOUNTER — Other Ambulatory Visit: Payer: Self-pay

## 2021-04-04 DIAGNOSIS — M6281 Muscle weakness (generalized): Secondary | ICD-10-CM | POA: Diagnosis not present

## 2021-04-04 DIAGNOSIS — R2689 Other abnormalities of gait and mobility: Secondary | ICD-10-CM

## 2021-04-04 DIAGNOSIS — R2681 Unsteadiness on feet: Secondary | ICD-10-CM

## 2021-04-04 NOTE — Therapy (Signed)
[place] MAIN [facility] SERVICES [address] [place], [place], [address] Phone: [phone]   Fax:  [phone]  Physical Therapy Treatment  Patient Details  Name: Amber Vance MRN: [id number] Date of Birth: August 05, 1986 Referring Provider (PT): Dr. Sionne Gibraltar (PCP)/ Pearletha Forge MD (Neurologist)   Encounter Date: 04/04/2021   PT End of Session - 04/04/21 1412     Visit Number 2    Number of Visits 9    Date for PT Re-Evaluation 05/25/21    Authorization Type Medicaid    PT Start Time 1147    PT Stop Time 1230    PT Time Calculation (min) 43 min    Equipment Utilized During Treatment Gait belt    Activity Tolerance Patient tolerated treatment well;No increased pain    Behavior During Therapy WFL for tasks assessed/performed             Past Medical History:  Diagnosis Date   Anxiety    Back pain    Depression     History reviewed. No pertinent surgical history.  There were no vitals filed for this visit.   Subjective Assessment - 04/04/21 1156     Subjective Patient reports working on standing on her right leg feeling unsteady/unsure; No pain currently;    Patient is accompained by: Family member   dad, [name]   Pertinent History Amber Vance is a 35 y.o. right handed female with a past medical history significant for smoking, DVT, and PE who  is s/p left ACA/MCA stroke s/p hemicraniectomy . She is here with her dad, [name].     She presented to emergency room on 07/17/20 with right sided weakness/numbess and LOC and fell and hit her head.  She was transferred to [facility] for further work-up and care. Her CTA showed acute left ICA/MCA occlusion as well as proximal left ACA occlusion with dissection of the proximal left ICA distal to the bifurcation with severe stenosis of the proximal left ICA. MRI showed complete infarct of the left hemisphere. Hemicraniectomy was done on 07/18/2020 and she was extubated on 07/20/2020.  Etiology of her  stroke was thought to be cryptogenic but most likely embolic in the setting of hypercoagulable state although her hypercoagulable work-up was negative. 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 On 07/20/2020, she had a IVC filter placed.  Patient was in ICU for 2 months, [facility] for 7 weeks and then transferred to SNF for additional rehab for 3 months. She has been home since 02/15/21.   Patient has 2 small children (9 yo and 2 yo). Her parents recently retired and have been helping at home.   5/11- has initial consult for cranial reconstruction, The patient has opted to proceed with elevation of the skin flap and custom cranioplasty. She is scheduled for surgery on 04/19/21.   She has expressive aphasia. She lives at home with her dad. Her dad has been active in helping care for her. She does not have any home health services at this time. She presents to therapy with sling on RUE. She is also using an AFO on RLE for ambulation. She is currently walking mostly at home with [facility].  She has falling 1-2 times while at rehab with no injury. No falls at home. She presents to therapy  with helmet which she uses when she is up and walking. She has a 37 yo and a 37 yo at home. 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    How long can you sit comfortably? 1 hour, minimal diffiuclty;    How long can you stand comfortably? 20-30 min    How long can you walk comfortably? short distances in the home 100-200 feet with SPC    Patient Stated Goals walk better, get rid of SPC    Currently in Pain? No/denies    Multiple Pain Sites No                   TREATMENT: Standing on  RLE:  SLS with 1 hand hold to finger tip hold 10 sec hold x2 reps;  SLS with LLE hip flexion march x10 reps with 1 HHA and cues for increased ROM for better strengthening;  SLS with LLE Hip abduction SLR x10 reps with cues to keep left foot off floor for increased weight bearing in RLE  Squat x5 reps with neutral weight shift and 1 rail assist Squat, side/side weight shift x5 reps with1 rail assist, progressed to lateral weight shift without rail assist with mod VCs for motor control and positioning;   Sit<>Stand with purple pad under LLE to facilitate increased RLE weight bearing with arms across chest x5 reps with min A for safety  Leg press: RLE only 25# x10 reps with mod VCs/demonstration and mod A for RLE into proper position to avoid hip abduction/ER for better quad activation;    Provided written HEP for better adherence. Educated dad in optimal muscle activation and what to watch for with each exercise- see patient instructions.  Awaiting on OT/ST referrals;                   PT Short Term Goals - 03/30/21 1347       PT SHORT TERM GOAL #1   Title Patient will be adherent to HEP daily to improve strength and mobility    Baseline does not have a  HEP    Time 4    Period Weeks    Status New    Target Date 04/27/21      PT SHORT TERM GOAL #2   Title Patient will be able to transfer sit<>Stand with feet in neutral alignment with equal weight shift between each LE.    Baseline currently shifts to LLE with RLE advanced forward and minimal weight bearing in RLE    Time 4    Period Weeks    Status New    Target Date 04/27/21      PT SHORT TERM GOAL #3   Title Patient will be able to hold SLS for 5 sec on RLE with finger tip hold or less to improve weight acceptance on RLE.    Baseline unable to achieve SLS    Time 4    Period Weeks    Status New    Target Date 04/27/21               PT Long Term Goals - 03/30/21 1349       PT LONG TERM GOAL #1    Title Patient (< 40 years old) will complete five times sit to stand test in < 10 seconds indicating  an increased LE strength and improved balance.    Baseline 17.56 sec without HHA    Time 8    Period Weeks    Status New    Target Date 05/25/21      PT LONG TERM GOAL #2   Title Patient will increase 10 meter walk test to >0.8 m/s as to improve gait speed for better community ambulation and to reduce fall risk.    Baseline 0.5 m/s with SPC    Time 8    Period Weeks    Status New    Target Date 05/25/21      PT LONG TERM GOAL #3   Title Patient will increase RLE gross strength to 4+/5 in hip/knee as to improve functional strength for independent gait, increased standing tolerance and increased ADL ability.    Baseline 4-/5 to 3-/5 in RLE    Time 8    Period Weeks    Status New    Target Date 05/25/21      PT LONG TERM GOAL #4   Title Patient will improve FOTO score to >63% to indicate improved functional mobility with ADLs.    Baseline 60%    Time 8    Period Weeks    Status New    Target Date 05/25/21                   Plan - 04/04/21 1412     Clinical Impression Statement Patient motivated and participated well within session. She was instructed in RLE strengthening with increased stance on RLE to facilitate better motor control. Patient does have difficulty following instruction with new tasks requiring demonstration and max VCs/tactile cues for optimal exercise technique. She continues to have increased tone in RLE which makes AROM difficult. patient would benefit from additional skilled PT intervention to improve strength, balance and mobility;    Personal Factors and Comorbidities Comorbidity 3+;Time since onset of injury/illness/exacerbation    Comorbidities DVT/clots, anxiety/depression, former smoker    Examination-Activity Limitations Caring for Others;Locomotion Level;Squat;Stairs;Stand;Transfers    Examination-Participation Restrictions Cleaning;Community  Activity;Driving;Laundry;Shop;Volunteer;Yard Work;School;Occupation    Stability/Clinical Decision Making Stable/Uncomplicated    Rehab Potential Good    PT Frequency 1x / week    PT Duration 8 weeks    PT Treatment/Interventions Cryotherapy;Moist Heat;Gait training;Stair training;Functional mobility training;Therapeutic activities;Therapeutic exercise;Balance training;Neuromuscular re-education;Patient/family education;Orthotic Fit/Training;Passive range of motion;Energy conservation    PT Next Visit Plan advance HEP, assess balance    PT Home Exercise Plan initiated- see patient instructions    Consulted and Agree with Plan of Care Patient;Family member/caregiver    Family Member Consulted dad- [name]             Patient will benefit from skilled therapeutic intervention in order to improve the following deficits and impairments:  Abnormal gait, Decreased balance, Decreased endurance, Decreased mobility, Difficulty walking, Increased muscle spasms, Impaired perceived functional ability, Decreased activity tolerance, Decreased coordination, Decreased safety awareness, Decreased strength  Visit Diagnosis: Muscle weakness (generalized)  Unsteadiness on feet  Other abnormalities of gait and mobility     Problem List Patient Active Problem List   Diagnosis Date Noted   Hav (hallux abducto valgus), left 12/25/2018   [redacted] weeks gestation of pregnancy 07/19/2018   HPV (human papilloma virus) anogenital infection 06/14/2018   Tobacco smoking affecting pregnancy 06/14/2018   Supervision of high risk pregnancy in second trimester 02/26/2018   Lumbar radiculopathy 02/09/2017   Scoliosis of thoracolumbar spine 02/09/2017   Anemia due to vitamin B12  deficiency 06/07/2016   Paresthesia 05/16/2016   Thyroid nodule 05/16/2016   Anxiety and depression 03/16/2015   Fibromyalgia 02/08/2015   Galactorrhea 08/26/2013   Anxiety 02/04/2013   STD (female) 02/04/2013   Tobacco use 02/04/2013     Jerone Cudmore PT, DPT 04/04/2021, 2:14 PM  [place] MAIN [facility] SERVICES [address] [place], [place], [address] Phone: [phone]   Fax:  [phone]  Name: Amber Vance MRN: [id number] Date of Birth: [birth year]

## 2021-04-04 NOTE — Patient Instructions (Signed)
Access Code: [email or web address] URL: [email or web address] Date: 04/04/2021 Prepared by: Blanche East  Exercises Standing Hip Flexion March - 1 x daily - 7 x weekly - 2 sets - 10 reps Standing Hip Abduction with Counter Support - 1 x daily - 7 x weekly - 2 sets - 10 reps Mini Squat with Counter Support - 1 x daily - 7 x weekly - 2 sets - 10 reps

## 2021-04-06 ENCOUNTER — Ambulatory Visit: Payer: Medicaid Other | Admitting: Physical Therapy

## 2021-04-11 ENCOUNTER — Encounter: Payer: Self-pay | Admitting: Physical Therapy

## 2021-04-11 ENCOUNTER — Ambulatory Visit: Payer: Medicaid Other | Admitting: Speech Pathology

## 2021-04-11 ENCOUNTER — Ambulatory Visit: Payer: Medicaid Other | Admitting: Physical Therapy

## 2021-04-11 ENCOUNTER — Ambulatory Visit: Payer: Medicaid Other

## 2021-04-11 ENCOUNTER — Other Ambulatory Visit: Payer: Self-pay

## 2021-04-11 DIAGNOSIS — M6281 Muscle weakness (generalized): Secondary | ICD-10-CM

## 2021-04-11 DIAGNOSIS — R482 Apraxia: Secondary | ICD-10-CM

## 2021-04-11 DIAGNOSIS — R4701 Aphasia: Secondary | ICD-10-CM

## 2021-04-11 DIAGNOSIS — R2681 Unsteadiness on feet: Secondary | ICD-10-CM

## 2021-04-11 DIAGNOSIS — R278 Other lack of coordination: Secondary | ICD-10-CM

## 2021-04-11 DIAGNOSIS — R2689 Other abnormalities of gait and mobility: Secondary | ICD-10-CM

## 2021-04-11 NOTE — Therapy (Signed)
[place] MAIN [facility] SERVICES [address] [place], [place], [address] Phone: [phone]   Fax:  [phone]  Physical Therapy Treatment  Patient Details  Name: Amber Vance MRN: [id number] Date of Birth: March 28, 1986 Referring Provider (PT): Dr. Salome Holmes (PCP)/ Pearletha Forge MD (Neurologist)   Encounter Date: 04/11/2021   PT End of Session - 04/12/21 0914     Visit Number 3    Number of Visits 9    Date for PT Re-Evaluation 05/25/21    Authorization Type Medicaid    Authorization - Visit Number 2    PT Start Time 3235    PT Stop Time 1435    PT Time Calculation (min) 48 min    Equipment Utilized During Treatment Gait belt    Activity Tolerance Patient tolerated treatment well;No increased pain    Behavior During Therapy WFL for tasks assessed/performed             Past Medical History:  Diagnosis Date   Anxiety    Back pain    Depression     History reviewed. No pertinent surgical history.  There were no vitals filed for this visit.   Subjective Assessment - 04/11/21 1354     Subjective Patient reports working on HEP. She does report increased soreness after doing SLS activity and therefore didn't do as many.    Patient is accompained by: Family member   dad, Amber Vance   Pertinent History Amber Vance is a 35 y.o. right handed female with a past medical history significant for smoking, DVT, and PE who  is s/p left ACA/MCA stroke s/p hemicraniectomy . She is here with her dad, Amber Vance.     She presented to emergency room on 07/17/20 with right sided weakness/numbess and LOC and fell and hit her head.  She was transferred to [facility] for further work-up and care. Her CTA showed acute left ICA/MCA occlusion as well as proximal left ACA occlusion with dissection of the proximal left ICA distal to the bifurcation with severe stenosis of the proximal left ICA. MRI showed complete infarct of the left hemisphere. Hemicraniectomy was done on  07/18/2020 and she was extubated on 07/20/2020.  Etiology of her stroke was thought to be cryptogenic but most likely embolic in the setting of hypercoagulable state although her hypercoagulable work-up was negative. 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 On 07/20/2020, she had a IVC filter placed.  Patient was in ICU for 2 months, [facility] for 7 weeks and then transferred to SNF for additional rehab for 3 months. She has been home since 02/15/21.   Patient has 2 small children (37 yo and 2 yo). Her parents recently retired and have been helping at home.   5/11- has initial consult for cranial reconstruction, The patient has opted to proceed with elevation of the skin flap and custom cranioplasty. She is scheduled for surgery on 04/19/21.   She has expressive aphasia. She lives at home with her dad. Her dad has been active in helping care for her. She does not have any home health services at this time. She presents to therapy with sling on RUE. She is also using an AFO on RLE for ambulation. She is currently walking mostly at home with [facility].  She has falling 1-2 times  while at rehab with no injury. No falls at home. She presents to therapy with helmet which she uses when she is up and walking. She has a 37 yo and a 37 yo at home. She denies any pain; Denies numbness/tingling; Patient was going to school online at [employer] for  medical transcription and was working as a Educational psychologist at [employer]. She is currently not working and not in school.    How long can you sit comfortably? 1 hour, minimal diffiuclty;    How long can you stand comfortably? 20-30 min    How long can you walk comfortably? short distances in the home 100-200 feet with SPC    Patient Stated Goals walk better, get rid of SPC    Currently in Pain? No/denies    Multiple Pain  Sites No                       TREATMENT: Side stepping x10 feet x2 laps each Forward/backward walking x2 laps with mod VCs to increase RLE hip extension;  Patient required mod-max VCs for correct technique including to improve weight shift and increase step length on RLE. She also required cues for shifting to RLE and slowing down stepping with LLE for better motor control and stability;   Standing on airex pad: Toe taps with LLE to 4 inch step with 1-0 rail assist x10 reps; Standing left foot on step and right foot on 4 inch step: Unsupported standing Head turns side/side x5 reps Head turns up/down x5 reps; Patient unsupported, requiring min A for balance. She required cues for neutral weight shift for better stance control. She was most unsteady with lateral head turns  Forward step ups with right foot on 4 in step with 1 rail assist x10 reps with min A for safety; Required cues for eccentric control of knee flexion on RLE. Patient has difficulty unlocking right knee slowly with poor motor control;   Qped: Required mod A +2 for transitioning into qped position  PT Placed pball under stomach: PT provided min A to RUE for stabilizing elbow/wrist into qped: Forward/backward weight shift x5 reps Side/side weight shift x5 reps Removed pball Repeated without support with min A to RUE for stabilization   Tall kneeling: Squad to tall kneeling x10 reps unsupported with CGA for safety with neutral weight shift  Required min A to transition to sitting; Pt tolerated well with no pain reported;      Provided written HEP for better adherence. Educated dad in optimal muscle activation and what to watch for with each exercise- see patient instructions.  Awaiting on OT/ST evaluations;                   PT Education - 04/12/21 0911     Education Details Strengthening/HEP    Person(s) Educated Patient;Parent(s)    Methods Explanation;Verbal cues;Handout     Comprehension Verbalized understanding;Returned demonstration;Verbal cues required;Need further instruction              PT Short Term Goals - 03/30/21 1347       PT SHORT TERM GOAL #1   Title Patient will be adherent to HEP daily to improve strength and mobility    Baseline does not have a  HEP    Time 4    Period Weeks    Status New    Target Date 04/27/21      PT SHORT TERM GOAL #2   Title Patient will be  able to transfer sit<>Stand with feet in neutral alignment with equal weight shift between each LE.    Baseline currently shifts to LLE with RLE advanced forward and minimal weight bearing in RLE    Time 4    Period Weeks    Status New    Target Date 04/27/21      PT SHORT TERM GOAL #3   Title Patient will be able to hold SLS for 5 sec on RLE with finger tip hold or less to improve weight acceptance on RLE.    Baseline unable to achieve SLS    Time 4    Period Weeks    Status New    Target Date 04/27/21               PT Long Term Goals - 03/30/21 1349       PT LONG TERM GOAL #1   Title Patient (< 14 years old) will complete five times sit to stand test in < 10 seconds indicating an increased LE strength and improved balance.    Baseline 17.56 sec without HHA    Time 8    Period Weeks    Status New    Target Date 05/25/21      PT LONG TERM GOAL #2   Title Patient will increase 10 meter walk test to >0.8 m/s as to improve gait speed for better community ambulation and to reduce fall risk.    Baseline 0.5 m/s with SPC    Time 8    Period Weeks    Status New    Target Date 05/25/21      PT LONG TERM GOAL #3   Title Patient will increase RLE gross strength to 4+/5 in hip/knee as to improve functional strength for independent gait, increased standing tolerance and increased ADL ability.    Baseline 4-/5 to 3-/5 in RLE    Time 8    Period Weeks    Status New    Target Date 05/25/21      PT LONG TERM GOAL #4   Title Patient will improve FOTO score to  >63% to indicate improved functional mobility with ADLs.    Baseline 60%    Time 8    Period Weeks    Status New    Target Date 05/25/21                   Plan - 04/12/21 0914     Clinical Impression Statement Patient motivated and participated well within session.She is progressing well with LE strengthening although continues to have increased tone and compensatory strategies that affect motor control. Patient instructed in advanced HEP, see patient instructions. She does require mod-max VCs and tactile cues for correct muscle activation and exercise technique. Patient progressed exercise with qped positioning requiring mod A +2 to achieve position and therapist assistance for stabilizing RUE. She would benefit from additional skilled PT intervention to improve strength, balance and mobility;    Personal Factors and Comorbidities Comorbidity 3+;Time since onset of injury/illness/exacerbation    Comorbidities DVT/clots, anxiety/depression, former smoker    Examination-Activity Limitations Caring for Others;Locomotion Level;Squat;Stairs;Stand;Transfers    Examination-Participation Restrictions Cleaning;Community Activity;Driving;Laundry;Shop;Volunteer;Yard Work;School;Occupation    Stability/Clinical Decision Making Stable/Uncomplicated    Rehab Potential Good    PT Frequency 1x / week    PT Duration 8 weeks    PT Treatment/Interventions Cryotherapy;Moist Heat;Gait training;Stair training;Functional mobility training;Therapeutic activities;Therapeutic exercise;Balance training;Neuromuscular re-education;Patient/family education;Orthotic Fit/Training;Passive range of motion;Energy conservation    PT  Next Visit Plan advance HEP, assess balance    PT Home Exercise Plan initiated- see patient instructions    Consulted and Agree with Plan of Care Patient;Family member/caregiver    Family Member Consulted dad- Amber Vance             Patient will benefit from skilled therapeutic  intervention in order to improve the following deficits and impairments:  Abnormal gait, Decreased balance, Decreased endurance, Decreased mobility, Difficulty walking, Increased muscle spasms, Impaired perceived functional ability, Decreased activity tolerance, Decreased coordination, Decreased safety awareness, Decreased strength  Visit Diagnosis: Muscle weakness (generalized)  Unsteadiness on feet  Other abnormalities of gait and mobility     Problem List Patient Active Problem List   Diagnosis Date Noted   Hav (hallux abducto valgus), left 12/25/2018   [redacted] weeks gestation of pregnancy 07/19/2018   HPV (human papilloma virus) anogenital infection 06/14/2018   Tobacco smoking affecting pregnancy 06/14/2018   Supervision of high risk pregnancy in second trimester 02/26/2018   Lumbar radiculopathy 02/09/2017   Scoliosis of thoracolumbar spine 02/09/2017   Anemia due to vitamin B12 deficiency 06/07/2016   Paresthesia 05/16/2016   Thyroid nodule 05/16/2016   Anxiety and depression 03/16/2015   Fibromyalgia 02/08/2015   Galactorrhea 08/26/2013   Anxiety 02/04/2013   STD (female) 02/04/2013   Tobacco use 02/04/2013    Dolorez Jeffrey PT, DPT 04/12/2021, 9:19 AM  [place] MAIN [facility] SERVICES [address] [place], [place], [address] Phone: [phone]   Fax:  [phone]  Name: Amber Vance MRN: [id number] Date of Birth: [birth year]

## 2021-04-12 ENCOUNTER — Ambulatory Visit: Payer: Medicaid Other | Admitting: Speech Pathology

## 2021-04-12 ENCOUNTER — Other Ambulatory Visit: Payer: Self-pay

## 2021-04-12 ENCOUNTER — Encounter: Payer: Self-pay | Admitting: Speech Pathology

## 2021-04-12 ENCOUNTER — Ambulatory Visit: Payer: Medicaid Other

## 2021-04-12 ENCOUNTER — Ambulatory Visit: Payer: Medicaid Other | Admitting: Physical Therapy

## 2021-04-12 DIAGNOSIS — R482 Apraxia: Secondary | ICD-10-CM

## 2021-04-12 DIAGNOSIS — M6281 Muscle weakness (generalized): Secondary | ICD-10-CM | POA: Diagnosis not present

## 2021-04-12 DIAGNOSIS — R4701 Aphasia: Secondary | ICD-10-CM

## 2021-04-12 DIAGNOSIS — R278 Other lack of coordination: Secondary | ICD-10-CM

## 2021-04-12 NOTE — Therapy (Signed)
[place] MAIN [facility] SERVICES [address] [place], [place], [address] Phone: [phone]   Fax:  [phone]  Speech Language Pathology Evaluation  Patient Details  Name: Amber Vance MRN: [id number] Date of Birth: [birth year] Referring Provider (SLP): Florencia Reasons MD   Encounter Date: 04/11/2021   End of Session - 04/12/21 1536     Visit Number 1    Number of Visits 13    Date for SLP Re-Evaluation 05/23/21    Authorization Type [employer] Medicaid    Authorization - Visit Number 1    Progress Note Due on Visit 10    SLP Start Time 1500    SLP Stop Time  1600    SLP Time Calculation (min) 60 min    Activity Tolerance Patient tolerated treatment well             Past Medical History:  Diagnosis Date   Anxiety    Back pain    Depression     History reviewed. No pertinent surgical history.  There were no vitals filed for this visit.       SLP Evaluation [facility] - 04/12/21 1526       SLP Visit Information   SLP Received On 04/11/21    Referring Provider (SLP) Florencia Reasons MD    Onset Date 07/18/2020    Medical Diagnosis CVA      Subjective   Subjective Very positive and eager to work hard. Patient eager to meet new therapist.    Patient/Family Stated Goal Maximize communication      Pain Assessment   Currently in Pain? No/denies      General Information   HPI "Medical HX: Briefly, Amber Vance is a 35 y.o. female patient hx of recent DVT (on Eliquis) and anxiety who presented on 07/17/20 with of symptoms of acute right-sided numbness and altered LOC causing her to fall and hit her head while standing in her kitchen. 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 She underwent hemicraniectomy on 07/18/20  c/b herniation through the hemicrani site. Etiology of stroke remains cryptogenic but is likely embolic in setting of hypercoagulable state, though reason for underlying hypercoagulability is not clear. Extensive hypercoagulability workup negative. Cranioplasty was attempted on 10/26 and aborted due to significant necrotic tissue. The hospital course was further c/b agitation, delirium, vitamin D deficiency, dysphagia (now s/p PEG removal on 11/19), anemia s/p IV dextran, bacteremia with Acetinobacter/Bacillus Positive cultures, rash c/w livedo reticularis."      Prior Functional Status   Cognitive/Linguistic Baseline Within functional limits      Cognition   Overall Cognitive Status Within Functional Limits for tasks assessed      Auditory Comprehension   Overall Auditory Comprehension Impaired    Yes/No Questions Impaired    Commands Impaired    One Step Basic Commands 0-24% accurate    Conversation Simple    Other Conversation Comments Can express her wants and needs through use of vocal intonation, pointing and yes/no responses.      Reading Comprehension   Reading Status Not tested      Verbal Expression   Overall Verbal Expression Impaired    Initiation Impaired    Automatic Speech Counting;Day of week;Singing   with cues  Level of Generative/Spontaneous Verbalization Word    Repetition Impaired    Level of Impairment Word level    Naming Impairment    Responsive 0-25% accurate    Confrontation 0-24% accurate    Convergent Not tested    Divergent Not tested    Pragmatics No impairment      Written Expression   Written Expression Not tested      Oral Motor/Sensory Function   Overall Oral Motor/Sensory Function Impaired    Overall Oral Motor/Sensory Function minimal reduce strengtha dn range      Motor Speech   Overall Motor Speech Impaired    Respiration Within functional limits    Phonation Normal    Resonance Within functional limits    Articulation Impaired     Level of Impairment Word    Intelligibility Intelligibility reduced    Word 0-24% accurate    Conversation 0-24% accurate    Motor Planning Impaired    Level of Impairment Word    Motor Speech Errors Aware;Groping for words;Consistent    Phonation WFL      Standardized Assessments   Standardized Assessments  Western Aphasia Battery revised   Aphasia Quotient = 23.3/100                            SLP Education - 04/12/21 1534     Education Details Results and recommendations    Person(s) Educated Patient;Parent(s)    Methods Explanation    Comprehension Verbalized understanding                SLP Long Term Goals - 04/12/21 1540       SLP LONG TERM GOAL #1   Title Patient will demonstrate functional communication given support from communication partners    Baseline only those most familiar with patient can effectively communicate with patient    Time 6    Period Weeks    Status New    Target Date 05/23/21      SLP LONG TERM GOAL #2   Title Patient will name common objects, using multi-modal strategies, with 80% accuracy.    Baseline 10%    Time 6    Period Weeks    Status New    Target Date 05/23/21      SLP LONG TERM GOAL #3   Title Patient will follow simple directions, given muit-modal input, with 80% accuracy    Baseline 10%    Time 6    Period Weeks    Status New    Target Date 05/23/21      SLP LONG TERM GOAL #4   Title Patient will demonstrate reading comprehension at phrase level with 80% accuracy given min cues.    Baseline single words, 80%    Time 6    Period Weeks    Status New    Target Date 05/23/21              Plan - 04/12/21 1538     Clinical Impression Statement At 9 months post onset of CVA, the patient is presenting with severe aphasia and apraxia characterized by global aphasia (decreased yes/no accuracy, impaired ability to follow basic commands, impaired repetition, impaired naming with verbal expression  characterized by minimal verbal output). The patient has made improvements since the initial onset of aphasia and demonstrates high motivation to improve. The patient would benefit from skilled speech therapy for restorative and compensatory treatment of aphasia and apraxia.  Speech Therapy Frequency 2x / week    Duration --   6 weeks   Treatment/Interventions Language facilitation;Patient/family education    Potential to Achieve Goals Good    Potential Considerations Ability to learn/carryover information;Severity of impairments;Cooperation/participation level;Previous level of function;Family/community support    SLP Home Exercise Plan TBD    Consulted and Agree with Plan of Care Patient;Family member/caregiver    Family Member Consulted Father             Patient will benefit from skilled therapeutic intervention in order to improve the following deficits and impairments:   Aphasia - Plan: SLP plan of care cert/re-cert  Apraxia - Plan: SLP plan of care cert/re-cert    Problem List Patient Active Problem List   Diagnosis Date Noted   Hav (hallux abducto valgus), left 12/25/2018   [redacted] weeks gestation of pregnancy 07/19/2018   HPV (human papilloma virus) anogenital infection 06/14/2018   Tobacco smoking affecting pregnancy 06/14/2018   Supervision of high risk pregnancy in second trimester 02/26/2018   Lumbar radiculopathy 02/09/2017   Scoliosis of thoracolumbar spine 02/09/2017   Anemia due to vitamin B12 deficiency 06/07/2016   Paresthesia 05/16/2016   Thyroid nodule 05/16/2016   Anxiety and depression 03/16/2015   Fibromyalgia 02/08/2015   Galactorrhea 08/26/2013   Anxiety 02/04/2013   STD (female) 02/04/2013   Tobacco use 02/04/2013   Leroy Sea, MS/CCC- SLP  Lou Miner 04/12/2021, 3:53 PM  [place] MAIN [facility] SERVICES [address] [place], [place], [address] Phone: [phone]   Fax:  [phone]  Name:  Presly Steinruck MRN: [id number] Date of Birth: [birth year]

## 2021-04-12 NOTE — Therapy (Addendum)
[place] MAIN [facility] SERVICES [address] [place], [place], [address] Phone: [phone]   Fax:  [phone]  Occupational Therapy Evaluation  Patient Details  Name: Amber Vance MRN: [id number] Date of Birth: 1985/11/17 Referring Provider (OT): Dr. Salome Holmes   Encounter Date: 04/11/2021   OT End of Session - 04/12/21 0807     Visit Number 1    Number of Visits 10    Date for OT Re-Evaluation 07/01/21    Authorization - Visit Number 1    Authorization - Number of Visits 10    OT Start Time 1603    OT Stop Time 1700    OT Time Calculation (min) 57 min    Activity Tolerance Patient tolerated treatment well    Behavior During Therapy [facility] for tasks assessed/performed             Past Medical History:  Diagnosis Date   Anxiety    Back pain    Depression     History reviewed. No pertinent surgical history.  There were no vitals filed for this visit.   Subjective Assessment - 04/11/21 2119     Subjective  "We need to get that arm working." (per dad and pt confirms with a nod d/t expressive aphasia)    Patient is accompanied by: Family member    Pertinent History hx of DVTs/PEs    Patient Stated Goals To increase function in R dominant UE    Currently in Pain? Yes    Pain Score 2    FACES scale   Pain Location Shoulder    Pain Orientation Right    Pain Descriptors / Indicators Other (Comment)   pt unable to describe, but father reports likely arm pressure from hanging when not in sling   Pain Type Chronic pain    Pain Onset More than a month ago    Pain Frequency Occasional    Aggravating Factors  extensive time out of R arm sling    Pain Relieving Factors pain meds, positioning, use of sling    Effect of Pain on Daily Activities occasional discomfort with ADLs, but mild               [facility] OT Assessment - 04/11/21 2128       Assessment   Medical Diagnosis s/p L MCA/ACA stroke    Referring Provider  (OT) Dr. Salome Holmes    Onset Date/Surgical Date 07/17/20    Hand Dominance Right    Prior Therapy 7 weeks at [facility] inpatient rehab, 3 months in SNF.  Pt has been home approximately 6 weeks.  No home health.      Precautions   Precautions Other (comment)   wear helmet when up and walking   Required Braces or Orthoses Other Brace/Splint   RLE AFO, RUE sling, R resting hand splint     Restrictions   Weight Bearing Restrictions No      Balance Screen   Has the patient fallen in the past 6 months Yes    How many times? 1   pt fell out of bed at SNF   Has the patient had a decrease in activity level because of a fear of falling?  No    Is the patient reluctant to leave their home because of a fear of falling?  No      Home  Environment   Family/patient expects to be discharged to: Private residence    Living Arrangements Parent  Available Help at Discharge [place]   2 steps   Home Layout One level    Bathroom Shower/Tub Tub/Shower unit    [place] - single point;Tub bench;Grab bars - toilet;Grab bars - tub/shower;Transport chair    Lives With Family   father, step-mother, and 39 year old daughter.  Pt has another 2 year old daughter who has been staying with pt's ex-husband since pt's stroke, but pt has custody of both children.     Prior Function   Level of Independence Independent    Vocation Other (comment)   had been working at [employer] prior to the pandemic, and then started taking online courses.   Leisure doing puzzles and spending time with kids      ADL   Grooming Minimal assistance   pt brushes hair and teeth, applies deodarant all with 1 hand.  requires assist to put hair in pontytail d/t 2 hand task requirement.   ADL comments Pt able to perform basic self care with set up/supv using LUE and 1 handed techniques.  Pt has elastic shoe laces, elastic pants, and wears a sports bra to compensate for fastener difficulty.      IADL    Prior Level of Function Shopping indep    Prior Level of Function Light Housekeeping indep    Prior Level of Function Meal Prep indep    Prior Level of Function Community Mobility indep      Mobility   Mobility Status Comments SPC for household mobility, transport chair for community mobility      Written Expression   Dominant Hand Right    Written Experience Unable to assess (comment)   no distal active movement     Vision - History   Additional Comments pt's father reports initial peripheral deficits in R visual field.  Pt denies any problems, but difficult to accurately assess d/t expressive aphasia.      Activity Tolerance   Activity Tolerance Tolerates 10-20 min activity with multiple rests      Cognition   Cognition Comments expressive aphasia      Observation/Other Assessments   Skin Integrity grossly intact    Focus on Therapeutic Outcomes (FOTO)  24      Sensation   Light Touch Impaired Detail   Grossly intact for dorsal and volar right upper arm and forearm.  Impaired accuracy with light touch in R hand digits.     Coordination   Gross Motor Movements are Fluid and Coordinated No    Fine Motor Movements are Fluid and Coordinated No    Coordination and Movement Description absent movement throughout RUE      Tone   Assessment Location Right Upper Extremity      ROM / Strength   AROM / PROM / Strength PROM      PROM   Overall PROM  Deficits    Overall PROM Comments R shoulder flex/abd 0-90 (intentional stopping point d/t R shoulder sublux and limited scapular mobility). R elbow flex/ext full range with slow stretch, R wrist and digits can passively stretch to neutral with slow stretch.      Strength   Overall Strength Comments LUE 5/5 throughout; RUE 0/5      Hand Function   Right Hand Grip (lbs) unable to test    Left Hand Grip (lbs) 67 lbs    Left Hand Lateral Pinch 15 lbs    Left 3 point pinch 18 lbs  Comment LUE WNL; RUE unable to assess      RUE  Tone   RUE Tone Moderate      RLE Tone   RLE Tone Modified Ashworth      RLE Tone   Hypertonic Details increased flexor tone throughout shoulder, elbow, wrist, and hand    Modified Ashworth Scale for Grading Hypertonia RLE Considerable increase in muschle tone, passive movement difficult            OT Evaluation: Pt is a 35 y/o female s/p L MCA/ACA in Sept of 2021. Presents with R sided hemiplegia with hypertonicity throughout RUE and expressive aphasia.  Pt is accompanied by father whom she has been living with since her CVA.  Pt is ambulatory short distances with a cane.  Must wear helmet for all standing/amb activity.  Pt has cranioplasty planned on 04/19/21 d/t craniectomy performed post CVA after pt had sustained crush to skull d/t falling and hitting head while having her stroke. Pt has good support system at home with father and step-mother.  Step mother assists with personal care, though pt does well with 1 handed techniques and can perform most of her BADLs with set up-modified indep.  Pt's goal is to increase functional use of RUE.  Pt will benefit from OT to provide manual therapy, neuro re-ed, ADL training, pt/caregiver education in order to maximize functional use of RUE and manage hypertonicity.  Family is planning [place] the beach early Sept.       OT Education - 04/12/21 0801     Education Details OT role, goals/poc, splint wearing    Person(s) Educated Patient;Parent(s)    Methods Explanation;Verbal cues    Comprehension Verbalized understanding;Verbal cues required              OT Short Term Goals - 04/12/21 0813       OT SHORT TERM GOAL #1   Title Pt will perform RUE HEP independently.    Baseline Eval: initiated today with further instruction needed.    Time 12    Period Weeks    Status New    Target Date 07/01/21               OT Long Term Goals - 04/12/21 0814       OT LONG TERM GOAL #1   Title Pt will use RUE as a fair stabilizer  during ADL comletion.    Baseline Eval: Pt uses LUE to complete all functional activity.    Time 12    Period Weeks    Status New    Target Date 07/01/21      OT LONG TERM GOAL #2   Title Pt will demonstrate and incorporate RUE tone reduction strategies into ADL completion with min vc from caregiver.    Baseline Eval: dependent; education needed    Time 66    Period Weeks    Status New    Target Date 07/01/21      OT LONG TERM GOAL #3   Title RUE MMT to increase by 1 MM grade to work towards increased functional use of RUE as a stabilizer for ADLs.    Baseline Eval: MMT 0/5 throughout RUE.    Time 12    Period Weeks    Status New    Target Date 07/01/21      OT LONG TERM GOAL #4   Title Pt will increase FOTO score to 35 or better to indicate increased functional performance for  ADLs.    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 Presents with R sided hemiplegia with hypertonicity throughout RUE and expressive aphasia.  Pt is accompanied by father whom she has been living with since her CVA.  Pt is ambulatory short distances with a cane.  Must wear helmet for all standing/amb activity.  Pt has cranioplasty planned on 04/19/21 d/t craniectomy performed post CVA after pt had sustained crush to skull d/t falling and hitting head while having her stroke. Pt has good support system at home with father and step-mother.  Step mother assists with personal care, though pt does well with 1 handed techniques and can perform most of her BADLs with set up-modified indep.  Pt's goal is to increase functional use of RUE.  Pt will benefit from OT to provide manual therapy, neuro re-ed, ADL training, pt/caregiver education in order to maximize functional use of RUE and manage hypertonicity.  Family is planning to [place] the beach  early Sept.     OT Occupational Profile and History Detailed Assessment- Review of Records and additional review of physical, cognitive, psychosocial history related to current functional performance    Occupational performance deficits (Please refer to evaluation for details): ADL's;IADL's;Leisure    Body Structure / Function / Physical Skills ADL;Coordination;GMC;Muscle spasms;UE functional use;Body mechanics;Flexibility;IADL;Pain;FMC;Strength;ROM;Tone    Rehab Potential Good    Clinical Decision Making Several treatment options, min-mod task modification necessary    Comorbidities Affecting Occupational Performance: Presence of comorbidities impacting occupational performance    Comorbidities impacting occupational performance description: R sided hemiplegia, hypertonicity, expressive aphasia    Modification or Assistance to Complete Evaluation  Min-Moderate modification of tasks or assist with assess necessary to complete eval    OT Frequency 2x / week    OT Duration 12 weeks    OT Treatment/Interventions Self-care/ADL training;Therapeutic exercise;DME and/or AE instruction;Neuromuscular education;Manual Therapy;Splinting;Moist Heat;Energy conservation;Passive range of motion;Therapeutic activities;Patient/family education;Electrical Stimulation    Consulted and Agree with Plan of Care Patient;Family member/caregiver             Patient will benefit from skilled therapeutic intervention in order to improve the following deficits and impairments:   Body Structure / Function / Physical Skills: ADL, Coordination, GMC, Muscle spasms, UE functional use, Body mechanics, Flexibility, IADL, Pain, FMC, Strength, ROM, Tone       Visit Diagnosis: Muscle weakness (generalized)  Other lack of coordination    Problem List Patient Active Problem List   Diagnosis Date Noted   Hav (hallux abducto valgus), left 12/25/2018   [redacted] weeks gestation of pregnancy 07/19/2018   HPV (human papilloma  virus) anogenital infection 06/14/2018   Tobacco smoking affecting pregnancy 06/14/2018   Supervision of high risk pregnancy in second trimester 02/26/2018   Lumbar radiculopathy 02/09/2017   Scoliosis of thoracolumbar spine 02/09/2017   Anemia due to vitamin B12 deficiency 06/07/2016   Paresthesia 05/16/2016   Thyroid nodule 05/16/2016   Anxiety and depression 03/16/2015   Fibromyalgia 02/08/2015   Galactorrhea 08/26/2013   Anxiety 02/04/2013   STD (female) 02/04/2013   Tobacco use 02/04/2013   Leta Speller, MS, OTR/L  Darleene Cleaver 04/12/2021, 8:28 AM  [place]  [place] MAIN [facility] SERVICES [address] [place], [place], [address] Phone: [phone]   Fax:  [phone]  Name: Trissa Molina MRN: [id number] Date of Birth: 29-Jul-1986

## 2021-04-12 NOTE — Therapy (Addendum)
[place] MAIN [facility] SERVICES [address] [place], [place], [address] Phone: [phone]   Fax:  [phone]  Occupational Therapy Treatment  Patient Details  Name: Amber Vance MRN: [id number] Date of Birth: [birth year] Referring Provider (OT): Dr. Salome Holmes   Encounter Date: 04/12/2021   OT End of Session - 04/12/21 1340     Visit Number 2    Number of Visits 10    OT Start Time 5462    OT Stop Time 1148    OT Time Calculation (min) 45 min    Activity Tolerance Patient tolerated treatment well    Behavior During Therapy [facility] for tasks assessed/performed             Past Medical History:  Diagnosis Date   Anxiety    Back pain    Depression     History reviewed. No pertinent surgical history.  There were no vitals filed for this visit.   Subjective Assessment - 04/12/21 1338     Subjective  Pt's father reports no new complaints since yesterday.    Patient is accompanied by: Family member    Pertinent History hx of DVTs/PEs    Patient Stated Goals To increase function in R dominant UE    Currently in Pain? No/denies    Pain Onset More than a month ago    Multiple Pain Sites No            Occupational Therapy visit: Neuro muscular re-ed: Pt supine on mat for R scapular mobility and slow passive stretching throughout RUE, working to reduce hypertonicity in prep for AAROM.  Facilitation techniques provided by OT to complete R shoulder active assisted horiz abd/add, shoulder extension, shoulder abd/add, IR, elbow flexion/extension.  Passive stretching for R wrist and digit extension for contracture prevention and tone reduction.    Response to treatment: Good tolerance this day to all movements noted above with no reports of pain.  RUE more relaxed following passive stretching with OT able to palpate and identify muscle contraction with R shoulder horiz add, elbow flexion, IR.  Had been unable to note any muscle  contraction throughout RUE at eval yesterday without first having this passive movement and supine positioning achieved today.  Pt's father pleased to hear of noted muscle contraction during session but OT explained that it will be necessary to perform passive stretching frequently and ongoing in order to maximize motor potential throughout RUE.  Continued OT will benefit pt to provide neuro re-ed and manual therapy in order to maximize functional use for RUE for ADLs.       OT Education - 04/12/21 1339     Education Details HEP with reinforcement to complete slow stretch throughout RUE to avoid increase in tone.    Person(s) Educated Patient    Methods Explanation;Verbal cues;Demonstration    Comprehension Verbalized understanding;Verbal cues required              OT Short Term Goals - 04/12/21 0813       OT SHORT TERM GOAL #1   Title Pt will perform RUE HEP independently.    Baseline Eval: initiated today with further instruction needed.    Time 12    Period Weeks    Status New    Target Date 07/01/21               OT Long Term Goals - 04/12/21 0814       OT LONG TERM GOAL #1  Title Pt will use RUE as a fair stabilizer during ADL comletion.    Baseline Eval: Pt uses LUE to complete all functional activity.    Time 12    Period Weeks    Status New    Target Date 07/01/21      OT LONG TERM GOAL #2   Title Pt will demonstrate and incorporate RUE tone reduction strategies into ADL completion with min vc from caregiver.    Baseline Eval: dependent; education needed    Time 23    Period Weeks    Status New    Target Date 07/01/21      OT LONG TERM GOAL #3   Title RUE MMT to increase by 1 MM grade to work towards increased functional use of RUE as a stabilizer for ADLs.    Baseline Eval: MMT 0/5 throughout RUE.    Time 12    Period Weeks    Status New    Target Date 07/01/21      OT LONG TERM GOAL #4   Title Pt will increase FOTO score to 35 or better to  indicate increased functional performance for ADLs.    Baseline Eval: FOTO 24    Time 12    Period Weeks    Status New    Target Date 07/01/21              Plan - 04/12/21 1355     Clinical Impression Statement Good tolerance this day to all movements noted above with no reports of pain.  RUE more relaxed following passive stretching with OT able to palpate and identify muscle contraction with R shoulder horiz add, elbow flexion, IR.  Had been unable to note any muscle contraction throughout RUE at eval yesterday without first having this passive movement and supine positioning achieved today.  Pt's father pleased to hear of noted muscle contraction during session but OT explained that it will be necessary to perform passive stretching frequently and ongoing in order to maximize motor potential throughout RUE.  Continued OT will benefit pt to provide neuro re-ed and manual therapy in order to maximize functional use for RUE for ADLs.    OT Occupational Profile and History Detailed Assessment- Review of Records and additional review of physical, cognitive, psychosocial history related to current functional performance    Occupational performance deficits (Please refer to evaluation for details): ADL's;IADL's;Leisure    Body Structure / Function / Physical Skills ADL;Coordination;GMC;Muscle spasms;UE functional use;Body mechanics;Flexibility;IADL;Pain;FMC;Strength;ROM;Tone    Rehab Potential Good    Clinical Decision Making Several treatment options, min-mod task modification necessary    Comorbidities Affecting Occupational Performance: Presence of comorbidities impacting occupational performance    Comorbidities impacting occupational performance description: R sided hemiplegia, hypertonicity, expressive aphasia    Modification or Assistance to Complete Evaluation  Min-Moderate modification of tasks or assist with assess necessary to complete eval    OT Frequency 2x / week    OT Duration 12  weeks    OT Treatment/Interventions Self-care/ADL training;Therapeutic exercise;DME and/or AE instruction;Neuromuscular education;Manual Therapy;Splinting;Moist Heat;Energy conservation;Passive range of motion;Therapeutic activities;Patient/family education;Electrical Stimulation    Consulted and Agree with Plan of Care Patient;Family member/caregiver             Patient will benefit from skilled therapeutic intervention in order to improve the following deficits and impairments:   Body Structure / Function / Physical Skills: ADL, Coordination, GMC, Muscle spasms, UE functional use, Body mechanics, Flexibility, IADL, Pain, FMC, Strength, ROM, Tone  Visit Diagnosis: Muscle weakness (generalized)  Other lack of coordination    Problem List Patient Active Problem List   Diagnosis Date Noted   Hav (hallux abducto valgus), left 12/25/2018   [redacted] weeks gestation of pregnancy 07/19/2018   HPV (human papilloma virus) anogenital infection 06/14/2018   Tobacco smoking affecting pregnancy 06/14/2018   Supervision of high risk pregnancy in second trimester 02/26/2018   Lumbar radiculopathy 02/09/2017   Scoliosis of thoracolumbar spine 02/09/2017   Anemia due to vitamin B12 deficiency 06/07/2016   Paresthesia 05/16/2016   Thyroid nodule 05/16/2016   Anxiety and depression 03/16/2015   Fibromyalgia 02/08/2015   Galactorrhea 08/26/2013   Anxiety 02/04/2013   STD (female) 02/04/2013   Tobacco use 02/04/2013   Leta Speller, MS, OTR/L  Darleene Cleaver 04/12/2021, 1:57 PM  [place] MAIN [facility] SERVICES [address] [place], [place], [address] Phone: [phone]   Fax:  [phone]  Name: Caelyn Route MRN: [id number] Date of Birth: 03-17-1986

## 2021-04-12 NOTE — Addendum Note (Signed)
Addended by: Darleene Cleaver on: 04/12/2021 02:11 PM   Modules accepted: Orders

## 2021-04-13 ENCOUNTER — Encounter: Payer: Self-pay | Admitting: Speech Pathology

## 2021-04-13 ENCOUNTER — Ambulatory Visit: Payer: Medicaid Other | Admitting: Speech Pathology

## 2021-04-13 ENCOUNTER — Ambulatory Visit: Payer: Medicaid Other | Admitting: Physical Therapy

## 2021-04-13 NOTE — Therapy (Signed)
[place] MAIN [facility] SERVICES [place], [place], [address] Phone: [phone]   Fax:  [phone]  Speech Language Pathology Treatment  Patient Details  Name: Amber Vance MRN: [id number] Date of Birth: [birth year] Referring Provider (SLP): Florencia Reasons MD   Encounter Date: 04/12/2021   End of Session - 04/13/21 1555     Visit Number 2    Number of Visits 13    Date for SLP Re-Evaluation 05/23/21    Authorization Type [employer]    Authorization - Visit Number 2    Progress Note Due on Visit 10    SLP Start Time 1000    SLP Stop Time  1100    SLP Time Calculation (min) 60 min    Activity Tolerance Patient tolerated treatment well             Past Medical History:  Diagnosis Date   Anxiety    Back pain    Depression     History reviewed. No pertinent surgical history.  There were no vitals filed for this visit.   Subjective Assessment - 04/13/21 1553     Subjective Patient eager to participate in speech therapy                   ADULT SLP TREATMENT - 04/13/21 0001       General Information   Behavior/Cognition Alert;Cooperative;Pleasant mood    HPI "Medical HX: Briefly, Amber Vance is a 35 y.o. female patient hx of recent DVT (on Eliquis) and anxiety who presented on 07/17/20 with of symptoms of acute right-sided numbness and altered LOC causing her to fall and hit her head while standing in her kitchen. 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 She underwent hemicraniectomy on 07/18/20 c/b herniation through the hemicrani site. Etiology of stroke remains cryptogenic but is likely embolic in setting of hypercoagulable state, though reason for underlying hypercoagulability is  not clear. Extensive hypercoagulability workup negative. Cranioplasty was attempted on 10/26 and aborted due to significant necrotic tissue. The hospital course was further c/b agitation, delirium, vitamin D deficiency, dysphagia (now s/p PEG removal on 11/19), anemia s/p IV dextran, bacteremia with Acetinobacter/Bacillus Positive cultures, rash c/w livedo reticularis."      Treatment Provided   Treatment provided Cognitive-Linquistic      Pain Assessment   Pain Assessment No/denies pain      Cognitive-Linquistic Treatment   Treatment focused on Aphasia    Skilled Treatment AUDITORY COMPREHENSION: Demonstrate sentence comprehension (identify picture that best fits a sentence) with 80% accuracy.  READING: Match single word to picture (f=3) with 85% accuracy.  Accuracy decreases with increased cognitive load (match single word to synonym vs. 2 foils) to 40% accuracy.      Assessment / Recommendations / Plan   Plan Continue with current plan of care      Progression Toward Goals   Progression toward goals Progressing toward goals              SLP Education - 04/13/21 1554     Education Details Use multi-modal techniques to express yourself    Person(s) Educated Patient    Methods Explanation    Comprehension Verbalized understanding  SLP Long Term Goals - 04/12/21 1540       SLP LONG TERM GOAL #1   Title Patient will demonstrate functional communication given support from communication partners    Baseline only those most familiar with patient can effectively communicate with patient    Time 6    Period Weeks    Status New    Target Date 05/23/21      SLP LONG TERM GOAL #2   Title Patient will name common objects, using multi-modal strategies, with 80% accuracy.    Baseline 10%    Time 6    Period Weeks    Status New    Target Date 05/23/21      SLP LONG TERM GOAL #3   Title Patient will follow simple directions, given muit-modal input, with 80%  accuracy    Baseline 10%    Time 6    Period Weeks    Status New    Target Date 05/23/21      SLP LONG TERM GOAL #4   Title Patient will demonstrate reading comprehension at phrase level with 80% accuracy given min cues.    Baseline single words, 80%    Time 6    Period Weeks    Status New    Target Date 05/23/21              Plan - 04/13/21 1556     Clinical Impression Statement The patient is presenting with severe aphasia and apraxia characterized by global aphasia (decreased yes/no accuracy, impaired ability to follow basic commands, impaired repetition, impaired naming with verbal expression characterized by minimal verbal output). The patient has relative strengths in auditory and reading comprehension.  She has a tablet and several apps, per her father.  The patient has made improvements since the initial onset of aphasia and demonstrates high motivation to improve. The patient would benefit from skilled speech therapy for restorative and compensatory treatment of aphasia and apraxia.    Speech Therapy Frequency 2x / week    Duration --   6 weeks   Treatment/Interventions Language facilitation;Patient/family education    Potential to Achieve Goals Good    Potential Considerations Ability to learn/carryover information;Severity of impairments;Cooperation/participation level;Previous level of function;Family/community support    Consulted and Agree with Plan of Care Patient             Patient will benefit from skilled therapeutic intervention in order to improve the following deficits and impairments:   Aphasia  Apraxia    Problem List Patient Active Problem List   Diagnosis Date Noted   Hav (hallux abducto valgus), left 12/25/2018   [redacted] weeks gestation of pregnancy 07/19/2018   HPV (human papilloma virus) anogenital infection 06/14/2018   Tobacco smoking affecting pregnancy 06/14/2018   Supervision of high risk pregnancy in second trimester 02/26/2018    Lumbar radiculopathy 02/09/2017   Scoliosis of thoracolumbar spine 02/09/2017   Anemia due to vitamin B12 deficiency 06/07/2016   Paresthesia 05/16/2016   Thyroid nodule 05/16/2016   Anxiety and depression 03/16/2015   Fibromyalgia 02/08/2015   Galactorrhea 08/26/2013   Anxiety 02/04/2013   STD (female) 02/04/2013   Tobacco use 02/04/2013   Leroy Sea, MS/CCC- SLP  Lou Miner 04/13/2021, 3:58 PM  [place] MAIN [facility] SERVICES [address] [place], [place], [address] Phone: [phone]   Fax:  [phone]   Name: Amber Vance MRN: [id number] Date of Birth: 03-Sep-1986

## 2021-04-18 ENCOUNTER — Ambulatory Visit: Payer: Medicaid Other | Admitting: Physical Therapy

## 2021-04-18 ENCOUNTER — Other Ambulatory Visit: Payer: Self-pay

## 2021-04-18 ENCOUNTER — Ambulatory Visit: Payer: Medicaid Other | Admitting: Speech Pathology

## 2021-04-18 ENCOUNTER — Encounter: Payer: Self-pay | Admitting: Physical Therapy

## 2021-04-18 ENCOUNTER — Ambulatory Visit: Payer: Medicaid Other

## 2021-04-18 DIAGNOSIS — R2681 Unsteadiness on feet: Secondary | ICD-10-CM

## 2021-04-18 DIAGNOSIS — R278 Other lack of coordination: Secondary | ICD-10-CM

## 2021-04-18 DIAGNOSIS — R4701 Aphasia: Secondary | ICD-10-CM

## 2021-04-18 DIAGNOSIS — R482 Apraxia: Secondary | ICD-10-CM

## 2021-04-18 DIAGNOSIS — R2689 Other abnormalities of gait and mobility: Secondary | ICD-10-CM

## 2021-04-18 DIAGNOSIS — M6281 Muscle weakness (generalized): Secondary | ICD-10-CM | POA: Diagnosis not present

## 2021-04-18 NOTE — Therapy (Signed)
[place] MAIN [facility] SERVICES [address] [place], [place], [address] Phone: [phone]   Fax:  [phone]  Physical Therapy Treatment  Patient Details  Name: Amber Vance MRN: [id number] Date of Birth: 11/22/1985 Referring Provider (PT): Dr. Salome Holmes (PCP)/ Pearletha Forge MD (Neurologist)   Encounter Date: 04/18/2021   PT End of Session - 04/19/21 0914     Visit Number 4    Number of Visits 9    Date for PT Re-Evaluation 05/25/21    Authorization Type Medicaid    Authorization - Visit Number 3    PT Start Time 1146    PT Stop Time 1230    PT Time Calculation (min) 44 min    Equipment Utilized During Treatment Gait belt    Activity Tolerance Patient tolerated treatment well    Behavior During Therapy [facility] for tasks assessed/performed             Past Medical History:  Diagnosis Date   Anxiety    Back pain    Depression     History reviewed. No pertinent surgical history.  There were no vitals filed for this visit.   Subjective Assessment - 04/18/21 1150     Subjective Patient reports working on HEP. She does report increased soreness after doing SLS activity and therefore didn't do as many. She was unable to get into qped but has been working on the other exercise.    Patient is accompained by: Family member   dad, [name]   Pertinent History Amber Vance is a 35 y.o. right handed female with a past medical history significant for smoking, DVT, and PE who  is s/p left ACA/MCA stroke s/p hemicraniectomy . She is here with her dad, [name].     She presented to emergency room on 07/17/20 with right sided weakness/numbess and LOC and fell and hit her head.  She was transferred to [facility] for further work-up and care. Her CTA showed acute left ICA/MCA occlusion as well as proximal left ACA occlusion with dissection of the proximal left ICA distal to the bifurcation with severe stenosis of the proximal left ICA. MRI showed complete  infarct of the left hemisphere. Hemicraniectomy was done on 07/18/2020 and she was extubated on 07/20/2020.  Etiology of her stroke was thought to be cryptogenic but most likely embolic in the setting of hypercoagulable state although her hypercoagulable work-up was negative. 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 On 07/20/2020, she had a IVC filter placed.  Patient was in ICU for 2 months, [facility] for 7 weeks and then transferred to SNF for additional rehab for 3 months. She has been home since 02/15/21.   Patient has 2 small children (37 yo and 2 yo). Her parents recently retired and have been helping at home.   5/11- has initial consult for cranial reconstruction, The patient has opted to proceed with elevation of the skin flap and custom cranioplasty. She is scheduled for surgery on 04/19/21.   She has expressive aphasia. She lives at home with her dad. Her dad has been active in helping care for her. She does not have any home health services at this time. She presents to therapy with sling on RUE. She is also using an AFO on RLE for ambulation. She is  currently walking mostly at home with [facility].  She has falling 1-2 times while at rehab with no injury. No falls at home. She presents to therapy with helmet which she uses when she is up and walking. She has a 37 yo and a 37 yo at home. She denies any pain; Denies numbness/tingling; Patient was going to school online at [employer] for  medical transcription and was working as a Educational psychologist at [employer]. She is currently not working and not in school.    How long can you sit comfortably? 1 hour, minimal diffiuclty;    How long can you stand comfortably? 20-30 min    How long can you walk comfortably? short distances in the home 100-200 feet with SPC    Patient Stated Goals walk better, get rid  of SPC    Currently in Pain? No/denies    Pain Onset More than a month ago    Multiple Pain Sites No               TREATMENT:  Patient/caregiver expressed access to a pool. PT considered education in pool exercise but caregiver concerned due to patient's poor RLE ankle control  PT assessed RLE foot/ankle movement, with little to no motion identified. Pt exhibits increased RLE extensor tone with increased RLE ankle PF and IV  Instructed patient to work on sitting on steps or sitting on edge of pool and work on kicking right foot in water to help reduce tone and work towards increasing foot/ankle motion; Patient verbalized understanding  Expressed desire to educate patient/caregiver in gym exercise as a way to progress strengthening and conserve visits. Caregiver reported that while they do hope to join a gym soon he would rather that patient get as much therapy as possible for transitioning to the gym.  PT instructed patient in qped exercise: Educated patient in how to achieve qped position safely, leading with RLE Patient able to transition stand to qped with min A +1  Pt in tall kneeling: -side/side weight shift working on gluteal activation with hip shaking x10 reps, required CGA and cues to avoid lateral trunk lean -squat to tall kneeling x10 reps with CGA to close supervision and min VCs for erect posture to facilitate better gluteal activation  Transitioned to qped requiring max A for RUE placement with therapist stretching RUE out of flexor tone and providing support at right wrist and elbow; Qped: -side/side weight shift x10 reps; -shift to RUE lifting LUE x5-10 reps x2 sets; initially requiring assistance from therapist to support RUE at elbow, progressed to locking out elbow and being able to complete with only support at right wrist.   Patient able to transition from qped to sitting, min A with good safety awareness;   Will hold off on advancing HEP as patient is  scheduled for cranioplasty tomorrow;  BLE strengthening: Leg press: BLE 55# x5 reps with min A for RLE positioning and cues for proper exercise technique RLE only 25# x5 reps with min A and mod VCs for proper LE positioning and exercise technique. Patient has difficulty maintaining RLE position often rolling LE out into abduction and ER:   Patient reports feeling increased fatigue at end of session. Denies any pain;                         PT Education - 04/19/21 0913     Education Details strengthening/positioning, HEP    Person(s) Educated Patient;Parent(s)    Methods  Explanation;Verbal cues;Demonstration    Comprehension Verbalized understanding;Returned demonstration;Verbal cues required;Need further instruction              PT Short Term Goals - 03/30/21 1347       PT SHORT TERM GOAL #1   Title Patient will be adherent to HEP daily to improve strength and mobility    Baseline does not have a  HEP    Time 4    Period Weeks    Status New    Target Date 04/27/21      PT SHORT TERM GOAL #2   Title Patient will be able to transfer sit<>Stand with feet in neutral alignment with equal weight shift between each LE.    Baseline currently shifts to LLE with RLE advanced forward and minimal weight bearing in RLE    Time 4    Period Weeks    Status New    Target Date 04/27/21      PT SHORT TERM GOAL #3   Title Patient will be able to hold SLS for 5 sec on RLE with finger tip hold or less to improve weight acceptance on RLE.    Baseline unable to achieve SLS    Time 4    Period Weeks    Status New    Target Date 04/27/21               PT Long Term Goals - 03/30/21 1349       PT LONG TERM GOAL #1   Title Patient (< 51 years old) will complete five times sit to stand test in < 10 seconds indicating an increased LE strength and improved balance.    Baseline 17.56 sec without HHA    Time 8    Period Weeks    Status New    Target Date 05/25/21       PT LONG TERM GOAL #2   Title Patient will increase 10 meter walk test to >0.8 m/s as to improve gait speed for better community ambulation and to reduce fall risk.    Baseline 0.5 m/s with SPC    Time 8    Period Weeks    Status New    Target Date 05/25/21      PT LONG TERM GOAL #3   Title Patient will increase RLE gross strength to 4+/5 in hip/knee as to improve functional strength for independent gait, increased standing tolerance and increased ADL ability.    Baseline 4-/5 to 3-/5 in RLE    Time 8    Period Weeks    Status New    Target Date 05/25/21      PT LONG TERM GOAL #4   Title Patient will improve FOTO score to >63% to indicate improved functional mobility with ADLs.    Baseline 60%    Time 8    Period Weeks    Status New    Target Date 05/25/21                   Plan - 04/19/21 0916     Clinical Impression Statement Patient motivated and participated well within session. She was able to progress in qped with increased weight bearing in RUE, requiring less assistance. Patient continues to have increased RLE extensor tone requiring increased cues and min A for proper positioning. Discussed educating patient/caregiver in gym exercise to allow continued strengthening while conserving visits. Patient/caregiver open to gym program but expressed they want to recieve as much rehab  as possible before transitioning to the gym. Patient is scheduled for cranioplasty tomorrow. Will be on hold until safe to return to therapy;    Personal Factors and Comorbidities Comorbidity 3+;Time since onset of injury/illness/exacerbation    Comorbidities DVT/clots, anxiety/depression, former smoker    Examination-Activity Limitations Caring for Others;Locomotion Level;Squat;Stairs;Stand;Transfers    Examination-Participation Restrictions Cleaning;Community Activity;Driving;Laundry;Shop;Volunteer;Yard Work;School;Occupation    Stability/Clinical Decision Making Stable/Uncomplicated     Rehab Potential Good    PT Frequency 1x / week    PT Duration 8 weeks    PT Treatment/Interventions Cryotherapy;Moist Heat;Gait training;Stair training;Functional mobility training;Therapeutic activities;Therapeutic exercise;Balance training;Neuromuscular re-education;Patient/family education;Orthotic Fit/Training;Passive range of motion;Energy conservation    PT Next Visit Plan advance HEP, assess balance    PT Home Exercise Plan initiated- see patient instructions    Consulted and Agree with Plan of Care Patient;Family member/caregiver    Family Member Consulted dad- [name]             Patient will benefit from skilled therapeutic intervention in order to improve the following deficits and impairments:  Abnormal gait, Decreased balance, Decreased endurance, Decreased mobility, Difficulty walking, Increased muscle spasms, Impaired perceived functional ability, Decreased activity tolerance, Decreased coordination, Decreased safety awareness, Decreased strength  Visit Diagnosis: Muscle weakness (generalized)  Other lack of coordination  Unsteadiness on feet  Other abnormalities of gait and mobility     Problem List Patient Active Problem List   Diagnosis Date Noted   Hav (hallux abducto valgus), left 12/25/2018   [redacted] weeks gestation of pregnancy 07/19/2018   HPV (human papilloma virus) anogenital infection 06/14/2018   Tobacco smoking affecting pregnancy 06/14/2018   Supervision of high risk pregnancy in second trimester 02/26/2018   Lumbar radiculopathy 02/09/2017   Scoliosis of thoracolumbar spine 02/09/2017   Anemia due to vitamin B12 deficiency 06/07/2016   Paresthesia 05/16/2016   Thyroid nodule 05/16/2016   Anxiety and depression 03/16/2015   Fibromyalgia 02/08/2015   Galactorrhea 08/26/2013   Anxiety 02/04/2013   STD (female) 02/04/2013   Tobacco use 02/04/2013    Jayme Mednick PT, DPT 04/19/2021, 9:30 AM  [place] MAIN  [facility] SERVICES [place], [place], [address] Phone: [phone]   Fax:  [phone]  Name: Amber Vance MRN: [id number] Date of Birth: 1986-04-25

## 2021-04-18 NOTE — Therapy (Signed)
[place] MAIN [facility] SERVICES [address] [place], [place], [address] Phone: [phone]   Fax:  [phone]  Occupational Therapy Treatment  Patient Details  Name: Amber Vance MRN: [id number] Date of Birth: 25-Jul-1986 Referring Provider (OT): Dr. Salome Holmes   Encounter Date: 04/18/2021   OT End of Session - 04/18/21 1151     Visit Number 3    Number of Visits 10    Date for OT Re-Evaluation 07/01/21    Authorization - Visit Number 2    Authorization - Number of Visits 10    OT Start Time 1100    OT Stop Time 1145    OT Time Calculation (min) 45 min    Activity Tolerance Patient tolerated treatment well    Behavior During Therapy [facility] for tasks assessed/performed             Past Medical History:  Diagnosis Date   Anxiety    Back pain    Depression     History reviewed. No pertinent surgical history.  There were no vitals filed for this visit.   Subjective Assessment - 04/18/21 1149     Subjective  Father reports pt will have her cranioplasty procedure tomorrow morning at [facility].    Patient is accompanied by: Family member    Pertinent History hx of DVTs/PEs    Patient Stated Goals To increase function in R dominant UE    Currently in Pain? No/denies    Pain Onset More than a month ago    Multiple Pain Sites No            Occupational Therapy Treatment: Neuro re-ed: Pt participated in supine passive stretching for R shoulder, elbow, forearm, wrist, hand all planes.  Instructed pt/father on slow stretching technique for tone reduction throughout RUE.  Also instructed both in making sure to avoid R shoulder ROM >90 degrees d/t decreased R scapular mobility in order to reduce risk of soft tissue shoulder injury.  Facilitation techniques to complete AAROM for R shoulder horiz abd/add, abd/add, IR/ER, elbow flex/ext x10 each.  Passive stretching needed between exercises to reduce tone throughout RUE.  Facilitated  manual R scapular gliding all planes.  Instructed pt in self ROM techniques with hands clasped or L hand grasping R wrist for bilat shoulder flex/ext, horz abd/add, and elbow flex/ext with good return demo.    Response to treatment:   Slight discomfort noted with self RUE PROM noted by pt's facial expressions.  OT explained that pt can keep all ROM within a tolerable/pain free range.  Reinforced to complete self ROM daily in order to reduce tone and maximize potential for movement.  Father reports pt does stretch RUE daily but he is eager to learn some stretches and exercises that he can help her with at home.  OT demonstrated shoulder ROM at 90 degrees and below with verbal understanding from pt/father.  Pt will benefit from written handouts next session for passive stretching and AAROM to RUE for home program.  Encouraged completion of stretching and AAROM performed today to try at home in supine to minimize compensatory movements, but could also attempt R shoulder horiz abd/add on table top.  Pt/father verbalized understanding.  Pt will benefit from continued Occupational Therapy to address RUE hypertonicity and hemiplegia in order to maximize indep with ADLs/IADLs and increase functional use of RUE for self care.        OT Education - 04/18/21 1150     Education  Details slow passive stretch to reduce tone in RUE    Person(s) Educated Patient;Parent(s)    Methods Explanation;Verbal cues;Demonstration    Comprehension Verbalized understanding;Verbal cues required              OT Short Term Goals - 04/12/21 0813       OT SHORT TERM GOAL #1   Title Pt will perform RUE HEP independently.    Baseline Eval: initiated today with further instruction needed.    Time 12    Period Weeks    Status New    Target Date 07/01/21               OT Long Term Goals - 04/12/21 0814       OT LONG TERM GOAL #1   Title Pt will use RUE as a fair stabilizer during ADL comletion.    Baseline  Eval: Pt uses LUE to complete all functional activity.    Time 12    Period Weeks    Status New    Target Date 07/01/21      OT LONG TERM GOAL #2   Title Pt will demonstrate and incorporate RUE tone reduction strategies into ADL completion with min vc from caregiver.    Baseline Eval: dependent; education needed    Time 58    Period Weeks    Status New    Target Date 07/01/21      OT LONG TERM GOAL #3   Title RUE MMT to increase by 1 MM grade to work towards increased functional use of RUE as a stabilizer for ADLs.    Baseline Eval: MMT 0/5 throughout RUE.    Time 12    Period Weeks    Status New    Target Date 07/01/21      OT LONG TERM GOAL #4   Title Pt will increase FOTO score to 35 or better to indicate increased functional performance for ADLs.    Baseline Eval: FOTO 24    Time 12    Period Weeks    Status New    Target Date 07/01/21             Plan - 04/18/21 1208     Clinical Impression Statement Slight discomfort noted with self RUE PROM noted by pt's facial expressions.  OT explained that pt can keep all ROM within a tolerable/pain free range.  Reinforced to complete self ROM daily in order to reduce tone and maximize potential for movement.  Father reports pt does stretch RUE daily but he is eager to learn some stretches and exercises that he can help her with at home.  OT demonstrated shoulder ROM at 90 degrees and below with verbal understanding from pt/father.  Pt will benefit from written handouts next session for passive stretching and AAROM to RUE for home program.  Encouraged completion of stretching and AAROM performed today to try at home in supine to minimize compensatory movements, but could also attempt R shoulder horiz abd/add on table top.  Pt/father verbalized understanding.  Pt will benefit from continued Occupational Therapy to address RUE hypertonicity and hemiplegia in order to maximize indep with ADLs/IADLs and increase functional use of RUE for  self care.    OT Occupational Profile and History Detailed Assessment- Review of Records and additional review of physical, cognitive, psychosocial history related to current functional performance    Occupational performance deficits (Please refer to evaluation for details): ADL's;IADL's;Leisure    Body Structure / Function /  Physical Skills ADL;Coordination;GMC;Muscle spasms;UE functional use;Body mechanics;Flexibility;IADL;Pain;FMC;Strength;ROM;Tone    Rehab Potential Good    Clinical Decision Making Several treatment options, min-mod task modification necessary    Comorbidities Affecting Occupational Performance: Presence of comorbidities impacting occupational performance    Comorbidities impacting occupational performance description: R sided hemiplegia, hypertonicity, expressive aphasia    Modification or Assistance to Complete Evaluation  Min-Moderate modification of tasks or assist with assess necessary to complete eval    OT Frequency 2x / week    OT Duration 12 weeks    OT Treatment/Interventions Self-care/ADL training;Therapeutic exercise;DME and/or AE instruction;Neuromuscular education;Manual Therapy;Splinting;Moist Heat;Energy conservation;Passive range of motion;Therapeutic activities;Patient/family education;Electrical Stimulation    Consulted and Agree with Plan of Care Patient;Family member/caregiver              Patient will benefit from skilled therapeutic intervention in order to improve the following deficits and impairments:   Body Structure / Function / Physical Skills: ADL, Coordination, GMC, Muscle spasms, UE functional use, Body mechanics, Flexibility, IADL, Pain, FMC, Strength, ROM, Tone   Visit Diagnosis: Muscle weakness (generalized)  Other lack of coordination  Aphasia  Apraxia    Problem List Patient Active Problem List   Diagnosis Date Noted   Hav (hallux abducto valgus), left 12/25/2018   [redacted] weeks gestation of pregnancy 07/19/2018   HPV (human  papilloma virus) anogenital infection 06/14/2018   Tobacco smoking affecting pregnancy 06/14/2018   Supervision of high risk pregnancy in second trimester 02/26/2018   Lumbar radiculopathy 02/09/2017   Scoliosis of thoracolumbar spine 02/09/2017   Anemia due to vitamin B12 deficiency 06/07/2016   Paresthesia 05/16/2016   Thyroid nodule 05/16/2016   Anxiety and depression 03/16/2015   Fibromyalgia 02/08/2015   Galactorrhea 08/26/2013   Anxiety 02/04/2013   STD (female) 02/04/2013   Tobacco use 02/04/2013   Leta Speller, MS, OTR/L  Darleene Cleaver 04/18/2021, 12:13 PM  [place] MAIN [facility] SERVICES [address] [place], [place], [address] Phone: [phone]   Fax:  [phone]  Name: Treniya Lobb MRN: [id number] Date of Birth: 07-17-1986

## 2021-04-19 NOTE — Therapy (Signed)
[place] MAIN [facility] SERVICES [place], [place], [address] Phone: [phone]   Fax:  [phone]  Speech Language Pathology Treatment  Patient Details  Name: Amber Vance MRN: [id number] Date of Birth: 02-14-1986 Referring Provider (SLP): Amber Reasons MD   Encounter Date: 04/18/2021   End of Session - 04/19/21 0903     Visit Number 3    Number of Visits 13    Date for SLP Re-Evaluation 05/23/21    Authorization Type [employer]    Authorization - Visit Number 3    Progress Note Due on Visit 10    SLP Start Time 1000    SLP Stop Time  1100    SLP Time Calculation (min) 60 min    Activity Tolerance Patient tolerated treatment well             Past Medical History:  Diagnosis Date   Anxiety    Back pain    Depression     No past surgical history on file.  There were no vitals filed for this visit.   Subjective Assessment - 04/19/21 0851     Subjective Smiles and uses non-verbals to interact with SLP    Currently in Pain? No/denies                   ADULT SLP TREATMENT - 04/19/21 0852       General Information   Behavior/Cognition Alert;Cooperative;Pleasant mood    HPI "Medical HX: Briefly, Amber Vance is a 35 y.o. female patient hx of recent DVT (on Eliquis) and anxiety who presented on 07/17/20 with of symptoms of acute right-sided numbness and altered LOC causing her to fall and hit her head while standing in her kitchen. 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 She underwent hemicraniectomy on 07/18/20 c/b herniation through the hemicrani site. Etiology of stroke remains cryptogenic but is likely embolic in setting of hypercoagulable state, though reason for underlying  hypercoagulability is not clear. Extensive hypercoagulability workup negative. Cranioplasty was attempted on 10/26 and aborted due to significant necrotic tissue. The hospital course was further c/b agitation, delirium, vitamin D deficiency, dysphagia (now s/p PEG removal on 11/19), anemia s/p IV dextran, bacteremia with Acetinobacter/Bacillus Positive cultures, rash c/w livedo reticularis."      Treatment Provided   Treatment provided Cognitive-Linquistic      Pain Assessment   Pain Assessment No/denies pain      Cognitive-Linquistic Treatment   Treatment focused on Aphasia    Skilled Treatment Father asked SLP on way to treatment room about an app that would "help her talk." Reported communication breakdown this morning; pt pointing to show she wanted coffee but it took him several minutes to "guess" this. Provided education on free communication app (CoughDrop) as well as paid application ([id number]) for iPad. Demonstrated [employer] TouchTalk; with demonstration pt was able to navigate through simple menu choices (food, fruits, beverages) and subsequently able to indicate what she would have activated to let her dad know she wanted coffee this morning. Patient and father eager to begin device trial with [employer]; SLP to fill out request for trial device. Father had questions about an app where pt can type what she wants to say. SLP educated that aphasia  impacts all modalities of language, and that typing not likely to be practical for pt due to her writing being impacted. She was able to write her name, required max cues to write names of her children.  AUDITORY COMPREHENSION: Phrase completion (ID picture to fit phrase) 60% accuracy (F:3).  READING: Sentence completion with written word from F:4 60% accuracy, improves to 80% with auditory cues.      Assessment / Recommendations / Plan   Plan Continue with current plan of care      Progression Toward Goals   Progression toward goals  Progressing toward goals              SLP Education - 04/19/21 0902     Education Details AAC options, local aphasia resources    Person(s) Educated Patient;Parent(s)    Methods Explanation    Comprehension Verbalized understanding                SLP Long Term Goals - 04/12/21 1540       SLP LONG TERM GOAL #1   Title Patient will demonstrate functional communication given support from communication partners    Baseline only those most familiar with patient can effectively communicate with patient    Time 6    Period Weeks    Status New    Target Date 05/23/21      SLP LONG TERM GOAL #2   Title Patient will name common objects, using multi-modal strategies, with 80% accuracy.    Baseline 10%    Time 6    Period Weeks    Status New    Target Date 05/23/21      SLP LONG TERM GOAL #3   Title Patient will follow simple directions, given muit-modal input, with 80% accuracy    Baseline 10%    Time 6    Period Weeks    Status New    Target Date 05/23/21      SLP LONG TERM GOAL #4   Title Patient will demonstrate reading comprehension at phrase level with 80% accuracy given min cues.    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 The patient has relative strengths in auditory and reading comprehension.  She has a tablet and several apps, per her father; patient would like to trial AAC device to aid her communication at home.  The patient has made improvements since the initial onset of aphasia and demonstrates high motivation to improve. The patient would benefit from skilled speech therapy  for restorative and compensatory treatment of aphasia and apraxia.    Speech Therapy Frequency 2x / week    Duration --   6 weeks   Treatment/Interventions Language facilitation;Patient/family education    Potential to Achieve Goals Good    Potential Considerations Ability to learn/carryover information;Severity of impairments;Cooperation/participation level;Previous level of function;Family/community support    Consulted and Agree with Plan of Care Patient             Patient will benefit from skilled therapeutic intervention in order to improve the following deficits and  impairments:   Aphasia  Apraxia    Problem List Patient Active Problem List   Diagnosis Date Noted   Hav (hallux abducto valgus), left 12/25/2018   [redacted] weeks gestation of pregnancy 07/19/2018   HPV (human papilloma virus) anogenital infection 06/14/2018   Tobacco smoking affecting pregnancy 06/14/2018   Supervision of high risk pregnancy in second trimester 02/26/2018   Lumbar radiculopathy 02/09/2017   Scoliosis of thoracolumbar spine 02/09/2017   Anemia due to vitamin B12 deficiency 06/07/2016   Paresthesia 05/16/2016   Thyroid nodule 05/16/2016   Anxiety and depression 03/16/2015   Fibromyalgia 02/08/2015   Galactorrhea 08/26/2013   Anxiety 02/04/2013   STD (female) 02/04/2013   Tobacco use 02/04/2013   Deneise Lever, [place], [place] Speech-Language Pathologist  Aliene Altes 04/19/2021, 9:05 AM  [place] [address] [place], [place], [address] Phone: [phone]   Fax:  [phone]   Name: Amber Vance MRN: [id number] Date of Birth: [birth year]

## 2021-04-20 ENCOUNTER — Ambulatory Visit: Payer: Medicaid Other | Admitting: Physical Therapy

## 2021-04-26 ENCOUNTER — Ambulatory Visit: Payer: Medicaid Other | Admitting: Physical Therapy

## 2021-04-26 ENCOUNTER — Encounter: Payer: Medicaid Other | Admitting: Speech Pathology

## 2021-04-27 ENCOUNTER — Ambulatory Visit: Payer: Medicaid Other

## 2021-04-27 ENCOUNTER — Other Ambulatory Visit: Payer: Self-pay

## 2021-04-27 ENCOUNTER — Ambulatory Visit: Payer: Medicaid Other | Attending: Family Medicine | Admitting: Speech Pathology

## 2021-04-27 DIAGNOSIS — R482 Apraxia: Secondary | ICD-10-CM | POA: Insufficient documentation

## 2021-04-27 DIAGNOSIS — M6281 Muscle weakness (generalized): Secondary | ICD-10-CM | POA: Diagnosis present

## 2021-04-27 DIAGNOSIS — R2689 Other abnormalities of gait and mobility: Secondary | ICD-10-CM | POA: Insufficient documentation

## 2021-04-27 DIAGNOSIS — R4701 Aphasia: Secondary | ICD-10-CM | POA: Diagnosis present

## 2021-04-27 DIAGNOSIS — R278 Other lack of coordination: Secondary | ICD-10-CM | POA: Diagnosis present

## 2021-04-27 DIAGNOSIS — R2681 Unsteadiness on feet: Secondary | ICD-10-CM | POA: Insufficient documentation

## 2021-04-27 NOTE — Therapy (Signed)
[place] MAIN [facility] SERVICES [address] [place], [place], [address] Phone: [phone]   Fax:  [phone]  Speech Language Pathology Treatment  Patient Details  Name: Amber Vance MRN: [id number] Date of Birth: July 03, 1986 Referring Provider (SLP): Florencia Reasons MD   Encounter Date: 04/27/2021   End of Session - 04/27/21 1546     Visit Number 4    Number of Visits 13    Date for SLP Re-Evaluation 05/23/21    Authorization Type [employer]    Authorization - Visit Number 4    Progress Note Due on Visit 10    SLP Start Time 1400    SLP Stop Time  1500    SLP Time Calculation (min) 60 min    Activity Tolerance Patient tolerated treatment well             Past Medical History:  Diagnosis Date   Anxiety    Back pain    Depression     No past surgical history on file.  There were no vitals filed for this visit.   Subjective Assessment - 04/27/21 1536     Subjective "Oh!" surprised to see AAC device trial had arrived    Patient is accompained by: Family member   Dad   Currently in Pain? No/denies                   ADULT SLP TREATMENT - 04/27/21 1536       General Information   Behavior/Cognition Alert;Cooperative;Pleasant mood    HPI "Medical HX: Briefly, Amber Vance is a 35 y.o. female patient hx of recent DVT (on Eliquis) and anxiety who presented on 07/17/20 with of symptoms of acute right-sided numbness and altered LOC causing her to fall and hit her head while standing in her kitchen. 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 She underwent hemicraniectomy on 07/18/20 c/b herniation through the hemicrani site. Etiology of stroke remains cryptogenic but is likely embolic in setting of  hypercoagulable state, though reason for underlying hypercoagulability is not clear. Extensive hypercoagulability workup negative. Cranioplasty was attempted on 10/26 and aborted due to significant necrotic tissue. The hospital course was further c/b agitation, delirium, vitamin D deficiency, dysphagia (now s/p PEG removal on 11/19), anemia s/p IV dextran, bacteremia with Acetinobacter/Bacillus Positive cultures, rash c/w livedo reticularis."      Treatment Provided   Treatment provided Cognitive-Linquistic      Pain Assessment   Pain Assessment No/denies pain      Cognitive-Linquistic Treatment   Treatment focused on Aphasia    Skilled Treatment Pt had cranioplasty last week; has approval to return to [facility], will see MD next week for return to PT/OT orders. No changes in communicative function since procedure. Initiated device trial for [employer] TouchTalk. Patient quickly returned demonstration for activating icons to indicate preferences; in many cases attempted imitation of targets successfully. Pt used verbals "Oh, dang!" "Oh, yes," and "Nooo" as well as facial expressions, gestures to indicate likes/dislikes. SLP demonstrated how to customize icons and created sections for pt's favorite foods as well as dislikes. Demo'd how to take photos with the device with min-mod cues required. Encouraged pt to take photos of family and her home environment. Pt appeared to enjoy  interacting with the device and told SLP "Thank you," was tearful at end of session.      Assessment / Recommendations / Plan   Plan Continue with current plan of care      Progression Toward Goals   Progression toward goals Progressing toward goals              SLP Education - 04/27/21 1544     Education Details AAC device trial, how to take pictures using the device    Person(s) Educated Patient    Methods Explanation    Comprehension Verbalized understanding                SLP Long Term Goals - 04/12/21 1540        SLP LONG TERM GOAL #1   Title Patient will demonstrate functional communication given support from communication partners    Baseline only those most familiar with patient can effectively communicate with patient    Time 6    Period Weeks    Status New    Target Date 05/23/21      SLP LONG TERM GOAL #2   Title Patient will name common objects, using multi-modal strategies, with 80% accuracy.    Baseline 10%    Time 6    Period Weeks    Status New    Target Date 05/23/21      SLP LONG TERM GOAL #3   Title Patient will follow simple directions, given muit-modal input, with 80% accuracy    Baseline 10%    Time 6    Period Weeks    Status New    Target Date 05/23/21      SLP LONG TERM GOAL #4   Title Patient will demonstrate reading comprehension at phrase level with 80% accuracy given min cues.    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 The patient has relative strengths in auditory and reading comprehension. Patient was enthusiastic and eager to use communication device to express likes and dislikes (food) today. The patient has made improvements since the initial onset of aphasia and demonstrates high motivation to improve. The patient would benefit from skilled speech therapy for restorative and compensatory treatment of aphasia and apraxia.    Speech Therapy Frequency 2x / week    Duration --   6 weeks   Treatment/Interventions Language facilitation;Patient/family education    Potential to Achieve Goals Good    Potential Considerations Ability to learn/carryover information;Severity of impairments;Cooperation/participation level;Previous level of function;Family/community support    Consulted and Agree with Plan  of Care Patient             Patient will benefit from skilled therapeutic intervention in order to improve the following deficits and impairments:   Aphasia  Apraxia    Problem List Patient Active Problem List   Diagnosis Date Noted   Hav (hallux abducto valgus), left 12/25/2018   [redacted] weeks gestation of pregnancy 07/19/2018   HPV (human papilloma virus) anogenital infection 06/14/2018   Tobacco smoking affecting pregnancy 06/14/2018   Supervision of high risk pregnancy in second trimester 02/26/2018   Lumbar radiculopathy 02/09/2017   Scoliosis of thoracolumbar  spine 02/09/2017   Anemia due to vitamin B12 deficiency 06/07/2016   Paresthesia 05/16/2016   Thyroid nodule 05/16/2016   Anxiety and depression 03/16/2015   Fibromyalgia 02/08/2015   Galactorrhea 08/26/2013   Anxiety 02/04/2013   STD (female) 02/04/2013   Tobacco use 02/04/2013   Deneise Lever, [place], CCC-SLP Speech-Language Pathologist  Aliene Altes 04/27/2021, 3:53 PM  [place] MAIN [facility] SERVICES [address] [place], [place], [address] Phone: [phone]   Fax:  [phone]   Name: Amber Vance MRN: [id number] Date of Birth: 06-12-1986

## 2021-04-29 ENCOUNTER — Ambulatory Visit: Payer: Medicaid Other

## 2021-05-02 ENCOUNTER — Other Ambulatory Visit: Payer: Self-pay

## 2021-05-02 ENCOUNTER — Ambulatory Visit: Payer: Medicaid Other

## 2021-05-02 ENCOUNTER — Ambulatory Visit: Payer: Medicaid Other | Admitting: Speech Pathology

## 2021-05-02 DIAGNOSIS — R4701 Aphasia: Secondary | ICD-10-CM | POA: Diagnosis not present

## 2021-05-02 DIAGNOSIS — R482 Apraxia: Secondary | ICD-10-CM

## 2021-05-03 ENCOUNTER — Ambulatory Visit: Payer: Medicaid Other | Admitting: Physical Therapy

## 2021-05-03 NOTE — Therapy (Signed)
[place] MAIN [facility] SERVICES [address] [place], [place], [address] Phone: [phone]   Fax:  [phone]  Speech Language Pathology Treatment  Patient Details  Name: Amber Vance MRN: [id number] Date of Birth: 1986-03-31 Referring Provider (SLP): Florencia Reasons MD   Encounter Date: 05/02/2021   End of Session - 05/03/21 1427     Visit Number 5    Number of Visits 13    Date for SLP Re-Evaluation 05/23/21    Authorization Type [employer]    Authorization - Visit Number 5    Progress Note Due on Visit 10    SLP Start Time 1500    SLP Stop Time  1610    SLP Time Calculation (min) 70 min    Activity Tolerance Patient tolerated treatment well             Past Medical History:  Diagnosis Date   Anxiety    Back pain    Depression     No past surgical history on file.  There were no vitals filed for this visit.   Subjective Assessment - 05/03/21 1424     Subjective Did not take any photos over the weekend; took the device on a long car trip    Currently in Pain? No/denies                   ADULT SLP TREATMENT - 05/03/21 1426       General Information   Behavior/Cognition Alert;Cooperative;Pleasant mood    HPI "Medical HX: Briefly, Amber Vance is a 35 y.o. female patient hx of recent DVT (on Eliquis) and anxiety who presented on 07/17/20 with of symptoms of acute right-sided numbness and altered LOC causing her to fall and hit her head while standing in her kitchen. 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 She underwent hemicraniectomy on 07/18/20 c/b herniation through the hemicrani site. Etiology of stroke remains cryptogenic but is likely embolic in setting of hypercoagulable state,  though reason for underlying hypercoagulability is not clear. Extensive hypercoagulability workup negative. Cranioplasty was attempted on 10/26 and aborted due to significant necrotic tissue. The hospital course was further c/b agitation, delirium, vitamin D deficiency, dysphagia (now s/p PEG removal on 11/19), anemia s/p IV dextran, bacteremia with Acetinobacter/Bacillus Positive cultures, rash c/w livedo reticularis."      Treatment Provided   Treatment provided Cognitive-Linquistic      Pain Assessment   Pain Assessment No/denies pain      Cognitive-Linquistic Treatment   Treatment focused on Aphasia;Patient/family/caregiver education    Skilled Treatment Patient/family did not take any photos over the weekend. Reviewed this procedure and provided written list of suggestions. Family (and pt's daughter) had generated a list of favorite foods and music. Dad mentioned having pt use a keyboard to type what she wants to say. SLP re-educated that with pt's language impairment, aphasia impacts all modalities of language, including writing, rendering this an ineffective means of communication for her. SLP demo'd how to create icons and add photos via camera or web search. Pt typed [place] [employer] into the text bar, copying handwritten text with extended time, usual mod cues and brief expression of frustration. Father able to return demonstration for creating icons. Explained that SLP and  family can add content to pt's device to make it easier for her to communicate about specific topics; pt's focus should be on using the device to communicate. Demonstrated scenarios family can create opportunity to use her device, such as choosing food items or music she wants to listen to. SLP used [employer] with pt to select her favorite items at [employer]; customized section on her device and pt able to role play ordering at a restaurant with occasional mod cues. SLP also customized icons for pt to request a break and time  alone if she is feeling frustrated.      Assessment / Recommendations / Plan   Plan Continue with current plan of care   will decrease frequency to 1x per week to prolong duration of ST due to limited therapy visits     Progression Toward Goals   Progression toward goals Progressing toward goals                  SLP Long Term Goals - 04/12/21 1540       SLP LONG TERM GOAL #1   Title Patient will demonstrate functional communication given support from communication partners    Baseline only those most familiar with patient can effectively communicate with patient    Time 6    Period Weeks    Status New    Target Date 05/23/21      SLP LONG TERM GOAL #2   Title Patient will name common objects, using multi-modal strategies, with 80% accuracy.    Baseline 10%    Time 6    Period Weeks    Status New    Target Date 05/23/21      SLP LONG TERM GOAL #3   Title Patient will follow simple directions, given muit-modal input, with 80% accuracy    Baseline 10%    Time 6    Period Weeks    Status New    Target Date 05/23/21      SLP LONG TERM GOAL #4   Title Patient will demonstrate reading comprehension at phrase level with 80% accuracy given min cues.    Baseline single words, 80%    Time 6    Period Weeks    Status New    Target Date 05/23/21              Plan - 05/03/21 1427     Clinical Impression Statement Patient continues with severe aphasia and apraxia characterized by global aphasia with greater expressive than receptive impairments. The patient has relative strengths in auditory and reading comprehension. Patient is navigating communication device and locating familiar items with min-mod cues. The patient has made improvements since the initial onset of aphasia and demonstrates high motivation to improve. While patient would continue to benefit from therapy at frequency of 2x per week, decision with pt/family was made to reduce frequency to 1x per week to  prolong course of therapy due to limited insurance visits. The patient would benefit from skilled speech therapy for restorative and compensatory treatment of aphasia and apraxia.    Speech Therapy Frequency 1x /week    Duration --   6 weeks   Treatment/Interventions Language facilitation;Patient/family education    Potential to Achieve Goals Good    Potential Considerations Ability to learn/carryover information;Severity of impairments;Cooperation/participation level;Previous level of function;Family/community support;Financial resources    Consulted and Agree with Plan of Care Patient             Patient will  benefit from skilled therapeutic intervention in order to improve the following deficits and impairments:   Aphasia  Apraxia    Problem List Patient Active Problem List   Diagnosis Date Noted   Hav (hallux abducto valgus), left 12/25/2018   [redacted] weeks gestation of pregnancy 07/19/2018   HPV (human papilloma virus) anogenital infection 06/14/2018   Tobacco smoking affecting pregnancy 06/14/2018   Supervision of high risk pregnancy in second trimester 02/26/2018   Lumbar radiculopathy 02/09/2017   Scoliosis of thoracolumbar spine 02/09/2017   Anemia due to vitamin B12 deficiency 06/07/2016   Paresthesia 05/16/2016   Thyroid nodule 05/16/2016   Anxiety and depression 03/16/2015   Fibromyalgia 02/08/2015   Galactorrhea 08/26/2013   Anxiety 02/04/2013   STD (female) 02/04/2013   Tobacco use 02/04/2013    Deneise Lever, [place], CCC-SLP Speech-Language Pathologist  Aliene Altes 05/03/2021, 4:23 PM  [place] [address] [place], [place], [address] Phone: [phone]   Fax:  [phone]   Name: Amber Vance MRN: [id number] Date of Birth: [birth year]

## 2021-05-04 ENCOUNTER — Ambulatory Visit: Payer: Medicaid Other | Admitting: Speech Pathology

## 2021-05-04 ENCOUNTER — Ambulatory Visit: Payer: Medicaid Other

## 2021-05-05 ENCOUNTER — Ambulatory Visit: Payer: Medicaid Other | Admitting: Physical Therapy

## 2021-05-10 ENCOUNTER — Ambulatory Visit: Payer: Medicaid Other | Admitting: Physical Therapy

## 2021-05-10 ENCOUNTER — Ambulatory Visit: Payer: Medicaid Other | Admitting: Speech Pathology

## 2021-05-10 ENCOUNTER — Encounter: Payer: Self-pay | Admitting: Physical Therapy

## 2021-05-10 ENCOUNTER — Ambulatory Visit: Payer: Medicaid Other

## 2021-05-10 ENCOUNTER — Other Ambulatory Visit: Payer: Self-pay

## 2021-05-10 DIAGNOSIS — R482 Apraxia: Secondary | ICD-10-CM

## 2021-05-10 DIAGNOSIS — R2681 Unsteadiness on feet: Secondary | ICD-10-CM

## 2021-05-10 DIAGNOSIS — R278 Other lack of coordination: Secondary | ICD-10-CM

## 2021-05-10 DIAGNOSIS — R4701 Aphasia: Secondary | ICD-10-CM

## 2021-05-10 DIAGNOSIS — M6281 Muscle weakness (generalized): Secondary | ICD-10-CM

## 2021-05-10 DIAGNOSIS — R2689 Other abnormalities of gait and mobility: Secondary | ICD-10-CM

## 2021-05-10 NOTE — Therapy (Signed)
[place] MAIN [facility] SERVICES [address] [place], [place], [address] Phone: [phone]   Fax:  [phone]  Occupational Therapy Treatment  Patient Details  Name: Amber Vance MRN: [id number] Date of Birth: 08-30-1986 Referring Provider (OT): Dr. Salome Holmes   Encounter Date: 05/10/2021   OT End of Session - 05/10/21 1722     Visit Number 4    Number of Visits 10    Date for OT Re-Evaluation 07/01/21    Authorization - Visit Number 4    Authorization - Number of Visits 10    OT Start Time 1025    OT Stop Time 1655    OT Time Calculation (min) 40 min    Equipment Utilized During Treatment cane    Activity Tolerance Patient tolerated treatment well    Behavior During Therapy [facility] for tasks assessed/performed             Past Medical History:  Diagnosis Date   Anxiety    Back pain    Depression     History reviewed. No pertinent surgical history.  There were no vitals filed for this visit.   Subjective Assessment - 05/10/21 1720     Subjective  Mother inquired about how high to lift pt's arm when helping her to shave axillary region. (see education section)    Patient is accompanied by: Family member    Pertinent History hx of DVTs/PEs    Patient Stated Goals To increase function in R dominant UE    Currently in Pain? No/denies    Pain Onset More than a month ago            Occupational Therapy Treatment: Self Care: Instructed pt/mother on grooming strategies for shaving axillary region.  Reinforced no R shoulder flex/abd past 90 degrees as to avoid shoulder injury/impingement, but encouraged R arm rest on caregiver shoulder at 90 degree level for caregiver to complete shaving.  If still challenging encouraged pt/caregiver try Nair/Veet lotion per instructions on tube as another strategy for hair removal.  Pt/mother receptive to recommendations.   Neuro re-ed: Instructed pt/mother in HEP for stretching  throughout RUE in order to prevent contractures, reduce tone, and maximize potential for functional movement for ADLs.  Instructed in table slides for R shoulder for flex/abd, self passive stretching with hands clasped to perform shoulder flex 0-90.  Return demo for mother to perform shoulder flex/abd/horiz abd/add, and pt return demo for forearm supination, wrist and digit extension stretches.  Good return demo from both.  Handout given and encouraged to bring handout back to review with pt's dad as needed.  Performed active assisted R scapular mobility with bilat shoulder shrugs, protraction/retraction x 2 sets 10 reps each.  Using facilitation techniques,performed AAROM for R shoulder flex, abd, add, horiz abd/add, elbow flex/ext, working to increase functional movement throughout RUE for self care.   Response to Treatment: Pt returns today after brief therapy hold following cranioplasty on 04/19/21.  Pt reports procedure went well and pt has no new complaints.  Mother present for therapy this day and OT was able to provide handout and instruction for HEP for passive stretching throughout RUE for contracture prevention, tone reduction, and improving RUE ROM for ADLs. Pt/mother with good return demo.  Pt will continue to benefit from skilled OT to provide neuro re-ed throughout RUE and ADL training in order to maximize functional use of RUE, maximize indep with self care, while reducing burden of care on family members.  OT Education - 05/10/21 1721     Education Details Reinforced passive stretching of R shoulder 0-90 degrees abd/flex for shaving axillary region as to avoid damage to shoulder; provided written HEP for pt/family passive stretching throughout RUE    Person(s) Educated Patient;Parent(s)    Methods Explanation;Verbal cues;Demonstration    Comprehension Verbalized understanding;Verbal cues required              OT Short Term Goals - 04/12/21 0813       OT SHORT TERM GOAL #1    Title Pt will perform RUE HEP independently.    Baseline Eval: initiated today with further instruction needed.    Time 12    Period Weeks    Status New    Target Date 07/01/21               OT Long Term Goals - 04/12/21 0814       OT LONG TERM GOAL #1   Title Pt will use RUE as a fair stabilizer during ADL comletion.    Baseline Eval: Pt uses LUE to complete all functional activity.    Time 12    Period Weeks    Status New    Target Date 07/01/21      OT LONG TERM GOAL #2   Title Pt will demonstrate and incorporate RUE tone reduction strategies into ADL completion with min vc from caregiver.    Baseline Eval: dependent; education needed    Time 34    Period Weeks    Status New    Target Date 07/01/21      OT LONG TERM GOAL #3   Title RUE MMT to increase by 1 MM grade to work towards increased functional use of RUE as a stabilizer for ADLs.    Baseline Eval: MMT 0/5 throughout RUE.    Time 12    Period Weeks    Status New    Target Date 07/01/21      OT LONG TERM GOAL #4   Title Pt will increase FOTO score to 35 or better to indicate increased functional performance for ADLs.    Baseline Eval: FOTO 24    Time 12    Period Weeks    Status New    Target Date 07/01/21              Plan - 05/10/21 1739     Clinical Impression Statement Pt returns today after brief therapy hold following cranioplasty on 04/19/21.  Pt reports procedure went well and pt has no new complaints.  Mother present for therapy this day and OT was able to provide handout and instruction for HEP for passive stretching throughout RUE for contracture prevention, tone reduction, and improving RUE ROM for ADLs. Pt/mother with good return demo.  Pt will continue to benefit from skilled OT to provide neuro re-ed throughout RUE and ADL training in order to maximize functional use of RUE, maximize indep with self care, while reducing burden of care on family members.    OT Occupational Profile and  History Detailed Assessment- Review of Records and additional review of physical, cognitive, psychosocial history related to current functional performance    Occupational performance deficits (Please refer to evaluation for details): ADL's;IADL's;Leisure    Body Structure / Function / Physical Skills ADL;Coordination;GMC;Muscle spasms;UE functional use;Body mechanics;Flexibility;IADL;Pain;FMC;Strength;ROM;Tone    Rehab Potential Good    Clinical Decision Making Several treatment options, min-mod task modification necessary    Comorbidities Affecting Occupational Performance: Presence of  comorbidities impacting occupational performance    Comorbidities impacting occupational performance description: R sided hemiplegia, hypertonicity, expressive aphasia    Modification or Assistance to Complete Evaluation  Min-Moderate modification of tasks or assist with assess necessary to complete eval    OT Frequency 2x / week    OT Duration 12 weeks    OT Treatment/Interventions Self-care/ADL training;Therapeutic exercise;DME and/or AE instruction;Neuromuscular education;Manual Therapy;Splinting;Moist Heat;Energy conservation;Passive range of motion;Therapeutic activities;Patient/family education;Electrical Stimulation    Consulted and Agree with Plan of Care Patient;Family member/caregiver             Patient will benefit from skilled therapeutic intervention in order to improve the following deficits and impairments:   Body Structure / Function / Physical Skills: ADL, Coordination, GMC, Muscle spasms, UE functional use, Body mechanics, Flexibility, IADL, Pain, FMC, Strength, ROM, Tone       Visit Diagnosis: Muscle weakness (generalized)  Other lack of coordination  Apraxia    Problem List Patient Active Problem List   Diagnosis Date Noted   Hav (hallux abducto valgus), left 12/25/2018   [redacted] weeks gestation of pregnancy 07/19/2018   HPV (human papilloma virus) anogenital infection  06/14/2018   Tobacco smoking affecting pregnancy 06/14/2018   Supervision of high risk pregnancy in second trimester 02/26/2018   Lumbar radiculopathy 02/09/2017   Scoliosis of thoracolumbar spine 02/09/2017   Anemia due to vitamin B12 deficiency 06/07/2016   Paresthesia 05/16/2016   Thyroid nodule 05/16/2016   Anxiety and depression 03/16/2015   Fibromyalgia 02/08/2015   Galactorrhea 08/26/2013   Anxiety 02/04/2013   STD (female) 02/04/2013   Tobacco use 02/04/2013   Leta Speller, MS, OTR/L  Darleene Cleaver 05/10/2021, 5:40 PM  [place] MAIN [facility] SERVICES [address] [place], [place], [address] Phone: [phone]   Fax:  [phone]  Name: Eimi Viney MRN: [id number] Date of Birth: 1986-09-14

## 2021-05-10 NOTE — Therapy (Signed)
[place] MAIN [facility] SERVICES [address] [place], [place], [address] Phone: [phone]   Fax:  [phone]  Physical Therapy Treatment/Re-eval  Patient Details  Name: Amber Vance MRN: [id number] Date of Birth: 1986-04-03 Referring Provider (PT): Dr. Salome Holmes (PCP)/ Pearletha Forge MD (Neurologist)   Encounter Date: 05/10/2021   PT End of Session - 05/11/21 0841     Visit Number 5    Number of Visits 9    Date for PT Re-Evaluation 05/25/21    Authorization Type Medicaid    Authorization - Visit Number 4    PT Start Time 9983    PT Stop Time 1430    PT Time Calculation (min) 47 min    Equipment Utilized During Treatment Gait belt    Activity Tolerance Patient tolerated treatment well    Behavior During Therapy [facility] for tasks assessed/performed             Past Medical History:  Diagnosis Date   Anxiety    Back pain    Depression     History reviewed. No pertinent surgical history.  There were no vitals filed for this visit.   Subjective Assessment - 05/10/21 1358     Subjective Patient is s/p cranioplasty on 6/28 with good results. she has been released for outpatient PT with minimal restrictions, just to avoid heavy lifting >40#    Patient is accompained by: --    Pertinent History Amber Vance is a 35 y.o. right handed female with a past medical history significant for smoking, DVT, and PE who  is s/p left ACA/MCA stroke s/p hemicraniectomy . She is here with her dad, Amber Vance.     She presented to emergency room on 07/17/20 with right sided weakness/numbess and LOC and fell and hit her head.  She was transferred to [facility] for further work-up and care. Her CTA showed acute left ICA/MCA occlusion as well as proximal left ACA occlusion with dissection of the proximal left ICA distal to the bifurcation with severe stenosis of the proximal left ICA. MRI showed complete infarct of the left hemisphere. Hemicraniectomy was done on  07/18/2020 and she was extubated on 07/20/2020.  Etiology of her stroke was thought to be cryptogenic but most likely embolic in the setting of hypercoagulable state although her hypercoagulable work-up was negative. 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 On 07/20/2020, she had a IVC filter placed.  Patient was in ICU for 2 months, [facility] for 7 weeks and then transferred to SNF for additional rehab for 3 months. She has been home since 02/15/21.   Patient has 2 small children (37 yo and 2 yo). Her parents recently retired and have been helping at home.   5/11- has initial consult for cranial reconstruction, The patient has opted to proceed with elevation of the skin flap and custom cranioplasty. She is scheduled for surgery on 04/19/21.   She has expressive aphasia. She lives at home with her dad. Her dad has been active in helping care for her. She does not have any home health services at this time. She presents to therapy with sling on RUE. She is also using an AFO on RLE for ambulation. She is currently walking mostly at home with [facility].  She has falling 1-2 times while  at rehab with no injury. No falls at home. She presents to therapy with helmet which she uses when she is up and walking. She has a 37 yo and a 37 yo at home. She denies any pain; Denies numbness/tingling; Patient was going to school online at [employer] for  medical transcription and was working as a Educational psychologist at [employer]. She is currently not working and not in school.    How long can you sit comfortably? 1 hour, minimal diffiuclty;    How long can you stand comfortably? 20-30 min    How long can you walk comfortably? short distances in the home 100-200 feet with SPC    Patient Stated Goals walk better, get rid of SPC    Currently in Pain? No/denies    Pain Onset More  than a month ago                [facility] PT Assessment - 05/11/21 0001       Observation/Other Assessments   Focus on Therapeutic Outcomes (FOTO)  61%, improved from 60% on 03/30/21      Standardized Balance Assessment   Five times sit to stand comments  18.57 sec without HHA (high risk for falls, more impaired than initial eval on 6/8 (17.56); continues to have heavy shift to LLE    10 Meter Walk 0.58 m/s with SPC, limited home ambulator slight improvement from 03/30/21 which was 0.499 m/s                  TREATMENT: Qped, required mod A for positioning (Max A for placement of RUE to reduce flexor tone)  Forward/backward weight shift x5 reps with mod VCs and demonstration for proper technique;   Lifting LUE to facilitate increased weight bearing in RUE x5 reps Tall kneeling, side stepping x5 feet x1 rep each direction, required min A for safety and mod VCs for weight shift Patient had increased tone at start of session having increased difficulty with qped positioning;   Instructed patient in 5 times sit<>Stand, 10 meter walk, see above as part of re-evaluation;   NDT Techniques with therapist sitting to patient's right side: Sit<>Stand with verbal/tactile cues to shift to RLE x2 reps Squat position side/side weight shift x10 reps to facilitate increased weight shift to RLE requiring verbal and tactile cues for shift to RLE;  Sit<>Stand with green pad under LLE to facilitate increased weight bearing in RLE x5 reps  Standing RLE SLS x1-2 sec x5 attempts  Patient tolerated session well. She does report fatigue at end of session; Denies any pain;                  PT Education - 05/11/21 0840     Education Details progress towards goals/positioning, weight shift    Person(s) Educated Patient    Methods Explanation;Verbal cues    Comprehension Verbalized understanding;Returned demonstration;Verbal cues required;Need further instruction              PT  Short Term Goals - 05/10/21 1404       PT SHORT TERM GOAL #1   Title Patient will be adherent to HEP daily to improve strength and mobility    Baseline does not have a  HEP, 7/19: doing 1x a week    Time 4    Period Weeks    Status Partially Met    Target Date 04/27/21      PT SHORT TERM GOAL #2   Title Patient will be  able to transfer sit<>Stand with feet in neutral alignment with equal weight shift between each LE.    Baseline currently shifts to LLE with RLE advanced forward and minimal weight bearing in RLE , 7/19: still has heavy lean to LLE    Time 4    Period Weeks    Status Not Met    Target Date 04/27/21      PT SHORT TERM GOAL #3   Title Patient will be able to hold SLS for 5 sec on RLE with finger tip hold or less to improve weight acceptance on RLE.    Baseline unable to achieve SLS, 7/19: unable    Time 4    Period Weeks    Status Not Met    Target Date 04/27/21               PT Long Term Goals - 05/10/21 1406       PT LONG TERM GOAL #1   Title Patient (< 63 years old) will complete five times sit to stand test in < 10 seconds indicating an increased LE strength and improved balance.    Baseline 17.56 sec without HHA, 7/19: 18.5 sec    Time 8    Period Weeks    Status Not Met    Target Date 05/25/21      PT LONG TERM GOAL #2   Title Patient will increase 10 meter walk test to >0.8 m/s as to improve gait speed for better community ambulation and to reduce fall risk.    Baseline 0.5 m/s with SPC, 7/19: 0.58 m/s    Time 8    Period Weeks    Status Partially Met    Target Date 05/25/21      PT LONG TERM GOAL #3   Title Patient will increase RLE gross strength to 4+/5 in hip/knee as to improve functional strength for independent gait, increased standing tolerance and increased ADL ability.    Baseline 4-/5 to 3-/5 in RLE, 7/19: no change    Time 8    Period Weeks    Status Not Met    Target Date 05/25/21      PT LONG TERM GOAL #4   Title Patient  will improve FOTO score to >63% to indicate improved functional mobility with ADLs.    Baseline 60%, 7/19: 61%    Time 8    Period Weeks    Status Partially Met    Target Date 05/25/21                   Plan - 05/11/21 0841     Clinical Impression Statement Patient presents after cranioplasty and reports she has been doing well. She has missed approximately 2-3 weeks of outpatient PT. She reports exercising with her dad 1x each week. Patient presents today with increased RUE flexor tone and increased tone in RLE. She required min-mod A for positioning into qped with max A for placement of RUE. Patient continues to require heavy cues/demonstration for direction to task due to aphasia. She continues to have heavy left LE weight shift with minimal weight shift towards RLE which limits gait ability and standing ADLs. She does not exhibit significant improvement in outcome measures likely due to missing several weeks of PT and limited adherence with HEP. She denies any pain. She would benefit from additional skilled PT Intervention to improve strength, balance and mobility;    Personal Factors and Comorbidities Comorbidity 3+;Time since onset of injury/illness/exacerbation  Comorbidities DVT/clots, anxiety/depression, former smoker    Examination-Activity Limitations Caring for Others;Locomotion Level;Squat;Stairs;Stand;Transfers    Examination-Participation Restrictions Cleaning;Community Activity;Driving;Laundry;Shop;Volunteer;Yard Work;School;Occupation    Stability/Clinical Decision Making Stable/Uncomplicated    Rehab Potential Good    PT Frequency 1x / week    PT Duration 8 weeks    PT Treatment/Interventions Cryotherapy;Moist Heat;Gait training;Stair training;Functional mobility training;Therapeutic activities;Therapeutic exercise;Balance training;Neuromuscular re-education;Patient/family education;Orthotic Fit/Training;Passive range of motion;Energy conservation    PT Next Visit  Plan advance HEP, assess balance    PT Home Exercise Plan initiated- see patient instructions    Consulted and Agree with Plan of Care Patient;Family member/caregiver    Family Member Consulted dad- Amber Vance             Patient will benefit from skilled therapeutic intervention in order to improve the following deficits and impairments:  Abnormal gait, Decreased balance, Decreased endurance, Decreased mobility, Difficulty walking, Increased muscle spasms, Impaired perceived functional ability, Decreased activity tolerance, Decreased coordination, Decreased safety awareness, Decreased strength  Visit Diagnosis: Muscle weakness (generalized)  Other lack of coordination  Unsteadiness on feet  Other abnormalities of gait and mobility     Problem List Patient Active Problem List   Diagnosis Date Noted   Hav (hallux abducto valgus), left 12/25/2018   [redacted] weeks gestation of pregnancy 07/19/2018   HPV (human papilloma virus) anogenital infection 06/14/2018   Tobacco smoking affecting pregnancy 06/14/2018   Supervision of high risk pregnancy in second trimester 02/26/2018   Lumbar radiculopathy 02/09/2017   Scoliosis of thoracolumbar spine 02/09/2017   Anemia due to vitamin B12 deficiency 06/07/2016   Paresthesia 05/16/2016   Thyroid nodule 05/16/2016   Anxiety and depression 03/16/2015   Fibromyalgia 02/08/2015   Galactorrhea 08/26/2013   Anxiety 02/04/2013   STD (female) 02/04/2013   Tobacco use 02/04/2013    Karem Farha PT, DPT 05/11/2021, 1:44 PM  [place] [facility] MAIN [facility] SERVICES [place], [place], [address] Phone: [phone]   Fax:  [phone]  Name: Amber Vance MRN: [id number] Date of Birth: [birth year]

## 2021-05-11 ENCOUNTER — Ambulatory Visit: Payer: Medicaid Other

## 2021-05-11 ENCOUNTER — Encounter: Payer: Medicaid Other | Admitting: Speech Pathology

## 2021-05-11 DIAGNOSIS — M6281 Muscle weakness (generalized): Secondary | ICD-10-CM

## 2021-05-11 DIAGNOSIS — R4701 Aphasia: Secondary | ICD-10-CM | POA: Diagnosis not present

## 2021-05-11 DIAGNOSIS — R278 Other lack of coordination: Secondary | ICD-10-CM

## 2021-05-11 DIAGNOSIS — R482 Apraxia: Secondary | ICD-10-CM

## 2021-05-11 NOTE — Therapy (Signed)
[place] MAIN [facility] SERVICES [address] [place], [place], [address] Phone: [phone]   Fax:  [phone]  Speech Language Pathology Treatment  Patient Details  Name: Amber Vance MRN: [id number] Date of Birth: [birth year] Referring Provider (SLP): Florencia Reasons MD   Encounter Date: 05/10/2021   End of Session - 05/11/21 0750     Visit Number 6    Number of Visits 13    Date for SLP Re-Evaluation 05/23/21    Authorization Type [employer] Medicaid    Authorization - Visit Number 6    Progress Note Due on Visit 10    SLP Start Time 1500    SLP Stop Time  1608    SLP Time Calculation (min) 68 min    Activity Tolerance Patient tolerated treatment well             Past Medical History:  Diagnosis Date   Anxiety    Back pain    Depression     No past surgical history on file.  There were no vitals filed for this visit.   Subjective Assessment - 05/11/21 0710     Subjective Took photos over the weekend; mom helped add new content to AAC device    Patient is accompained by: Family member   Mom   Currently in Pain? No/denies                   ADULT SLP TREATMENT - 05/11/21 0715       General Information   Behavior/Cognition Alert;Cooperative;Pleasant mood    HPI "Medical HX: Briefly, Amber Vance is a 35 y.o. female patient hx of recent DVT (on Eliquis) and anxiety who presented on 07/17/20 with of symptoms of acute right-sided numbness and altered LOC causing her to fall and hit her head while standing in her kitchen. 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 She underwent hemicraniectomy on 07/18/20 c/b herniation through the hemicrani site. Etiology of stroke remains cryptogenic but is likely  embolic in setting of hypercoagulable state, though reason for underlying hypercoagulability is not clear. Extensive hypercoagulability workup negative. Cranioplasty was attempted on 10/26 and aborted due to significant necrotic tissue. The hospital course was further c/b agitation, delirium, vitamin D deficiency, dysphagia (now s/p PEG removal on 11/19), anemia s/p IV dextran, bacteremia with Acetinobacter/Bacillus Positive cultures, rash c/w livedo reticularis."      Treatment Provided   Treatment provided Cognitive-Linquistic      Cognitive-Linquistic Treatment   Treatment focused on Aphasia;Patient/family/caregiver education    Skilled Treatment Mother reports she used AAC device with Amber Vance to request foods from the grocery store, and bought these items. Photos and icons added of pt's children. SLP reviewed with mother various ways to add icons and customize for pt. Pt able to imitate functional words (childrens' names, Mom, Dad) and SLP added recording to corresponding icons; pt appeared excited to be able to use her own voice by activating icons. Discussed communication breakdowns with pt/mother and added resolutions to common breakdowns (Can I have gum?, requesting patch for arm pain, medicine for headache, and leg brace adjustment) to "Things I need help with" on pt's device. Patient able to access/locate simple food/drink items, restaurants section from home screen with min cues. Pt  able to return demonstration for deleting icons not relevant to her.      Assessment / Recommendations / Plan   Plan Continue with current plan of care   1-2 times per week due to limited insurance visits     Progression Toward Goals   Progression toward goals Progressing toward goals              SLP Education - 05/11/21 0749     Education Details opportunites to use AAC, communication breakdowns    Person(s) Educated Patient;Parent(s)    Methods Explanation;Demonstration    Comprehension Verbalized  understanding;Need further instruction                SLP Long Term Goals - 04/12/21 1540       SLP LONG TERM GOAL #1   Title Patient will demonstrate functional communication given support from communication partners    Baseline only those most familiar with patient can effectively communicate with patient    Time 6    Period Weeks    Status New    Target Date 05/23/21      SLP LONG TERM GOAL #2   Title Patient will name common objects, using multi-modal strategies, with 80% accuracy.    Baseline 10%    Time 6    Period Weeks    Status New    Target Date 05/23/21      SLP LONG TERM GOAL #3   Title Patient will follow simple directions, given muit-modal input, with 80% accuracy    Baseline 10%    Time 6    Period Weeks    Status New    Target Date 05/23/21      SLP LONG TERM GOAL #4   Title Patient will demonstrate reading comprehension at phrase level with 80% accuracy given min cues.    Baseline single words, 80%    Time 6    Period Weeks    Status New    Target Date 05/23/21              Plan - 05/11/21 0750     Clinical Impression Statement Patient continues with severe aphasia and apraxia characterized by global aphasia with greater expressive than receptive impairments. The patient has relative strengths in auditory and reading comprehension. Patient is navigating communication device and locating familiar items with min-mod cues. The patient has made improvements since the initial onset of aphasia and demonstrates high motivation to improve. While patient would continue to benefit from therapy at frequency of 2x per week, decision with pt/family was made to continue at frequency of 1-2x per week to prolong course of therapy due to limited insurance visits. The patient would benefit from skilled speech therapy for restorative and compensatory treatment of aphasia and apraxia.    Speech Therapy Frequency --   1-2x per week   Duration --   6 weeks    Treatment/Interventions Language facilitation;Patient/family education    Potential to Achieve Goals Good    Potential Considerations Ability to learn/carryover information;Severity of impairments;Cooperation/participation level;Previous level of function;Family/community support;Financial resources    Consulted and Agree with Plan of Care Patient             Patient will benefit from skilled therapeutic intervention in order to improve the following deficits and impairments:   Aphasia  Apraxia    Problem List Patient Active Problem List   Diagnosis Date Noted   Hav (hallux abducto valgus), left 12/25/2018   [redacted] weeks gestation  of pregnancy 07/19/2018   HPV (human papilloma virus) anogenital infection 06/14/2018   Tobacco smoking affecting pregnancy 06/14/2018   Supervision of high risk pregnancy in second trimester 02/26/2018   Lumbar radiculopathy 02/09/2017   Scoliosis of thoracolumbar spine 02/09/2017   Anemia due to vitamin B12 deficiency 06/07/2016   Paresthesia 05/16/2016   Thyroid nodule 05/16/2016   Anxiety and depression 03/16/2015   Fibromyalgia 02/08/2015   Galactorrhea 08/26/2013   Anxiety 02/04/2013   STD (female) 02/04/2013   Tobacco use 02/04/2013   Deneise Lever, [place], [place] E Indea Dearman 05/11/2021, 7:52 AM  [place] MAIN [facility] SERVICES [address] [place], [place], [address] Phone: [phone]   Fax:  [phone]   Name: Amber Vance MRN: [id number] Date of Birth: 1985/12/30

## 2021-05-11 NOTE — Addendum Note (Signed)
Addended by: Blanche East E on: 05/11/2021 03:46 PM   Modules accepted: Orders

## 2021-05-12 ENCOUNTER — Ambulatory Visit: Payer: Medicaid Other | Admitting: Physical Therapy

## 2021-05-12 ENCOUNTER — Encounter: Payer: Medicaid Other | Admitting: Speech Pathology

## 2021-05-12 NOTE — Therapy (Signed)
[place] MAIN [facility] SERVICES [address] [place], [place], [address] Phone: [phone]   Fax:  [phone]  Occupational Therapy Treatment  Patient Details  Name: Amber Vance MRN: [id number] Date of Birth: 09/06/1986 Referring Provider (OT): Dr. Salome Holmes   Encounter Date: 05/11/2021   OT End of Session - 05/12/21 0812     Visit Number 5    Number of Visits 10    Date for OT Re-Evaluation 07/01/21    Authorization - Visit Number 5    Authorization - Number of Visits 10    OT Start Time 4818    OT Stop Time 1602    OT Time Calculation (min) 47 min    Equipment Utilized During Treatment cane, wc    Activity Tolerance Patient tolerated treatment well    Behavior During Therapy [facility] for tasks assessed/performed             Past Medical History:  Diagnosis Date   Anxiety    Back pain    Depression     No past surgical history on file.  There were no vitals filed for this visit.   Subjective Assessment - 05/11/21 0810     Subjective  "I think those exercises will be very helpful." (per dad)    Patient is accompanied by: Family member    Pertinent History hx of DVTs/PEs    Patient Stated Goals To increase function in R dominant UE    Currently in Pain? No/denies    Pain Score 0-No pain    Pain Onset More than a month ago            Occupational Therapy Treatment: Neuro re-ed: Pt in supine on mat to perform passive stretching to RUE all planes for shoulder, elbow, wrist, and hand, working to reduce tone and prep for AAROM.  Facilitation techniques to perform AAROM for R shoulder in supine, horiz abd/add, abd/add, and elbow extension.  Focussed on shoulder add and IR to work towards carrying light ADL supplies by hugging object between arm and R side of torso.  Participated in WB exercises through RUE while sitting EOB, therapist assisting to position R hand flat on mat, assisting with lateral shifting, and  practiced sit to stand with bearing more weight into RUE and RLE, requiring min A for weight shifting.   Self Care: Instructed pt in techniques to utilize RUE and legs as a stabilizer for opening containers.  Practiced placing jar between thighs and using L hand to open container.  Pt able to return demo and successfully place jar between legs and open/close with modified indep.  Practiced placing small pill bottles in R hand and between upper arm and torso or between forearm and abdomen to open/close bottles.  Pt able to perform with min vc with a variety of sizes and types of tops.  Practiced walking carrying a pillow between RUE and torso, relying on tone and activation of shoulder adduction and IR practiced on mat table.  Pt required min A to keep pillow from falling while walking ~40 ft.    Response to Treatment: Father present today with pt and reported that written HEP for passive stretching throughout RUE given to pt and mother last session looked manageable and helpful and did not report any questions at present.  Pt appeared excited and pleased to practice utilizing RUE as a stabilizer this day.  Noted muscle activation in supine for R shoulder add and IR, but pt  had difficulty with muscle activation of these muscle groups while ambulating, but OT encouraged pt continue to practice carrying light ADL supplies (ie pillow, towel) at home and reinforced that this dual tasking takes practice.  Advised on increased caution when objects are dropped as to avoid tripping.  Also reinforced to pt to continue to practice using RUE as a stabilizer to open jars/bottles in kitchen as able to maximize indep with making light meals/snacks at home.  Pt will continue to benefit from skilled OT to provide neuro re-ed to RUE and ADL training in order to maximize indep with self care.      OT Education - 05/11/21 [phone]     Education Details Instruction in strategies to utilize RUE as a stabilizer    Person(s)  Educated Patient;Parent(s)    Methods Explanation;Verbal cues;Demonstration    Comprehension Verbalized understanding;Verbal cues required              OT Short Term Goals - 04/12/21 0813       OT SHORT TERM GOAL #1   Title Pt will perform RUE HEP independently.    Baseline Eval: initiated today with further instruction needed.    Time 12    Period Weeks    Status New    Target Date 07/01/21               OT Long Term Goals - 04/12/21 0814       OT LONG TERM GOAL #1   Title Pt will use RUE as a fair stabilizer during ADL comletion.    Baseline Eval: Pt uses LUE to complete all functional activity.    Time 12    Period Weeks    Status New    Target Date 07/01/21      OT LONG TERM GOAL #2   Title Pt will demonstrate and incorporate RUE tone reduction strategies into ADL completion with min vc from caregiver.    Baseline Eval: dependent; education needed    Time 57    Period Weeks    Status New    Target Date 07/01/21      OT LONG TERM GOAL #3   Title RUE MMT to increase by 1 MM grade to work towards increased functional use of RUE as a stabilizer for ADLs.    Baseline Eval: MMT 0/5 throughout RUE.    Time 12    Period Weeks    Status New    Target Date 07/01/21      OT LONG TERM GOAL #4   Title Pt will increase FOTO score to 35 or better to indicate increased functional performance for ADLs.    Baseline Eval: FOTO 24    Time 12    Period Weeks    Status New    Target Date 07/01/21              Plan - 05/11/21 0836     Clinical Impression Statement Father present today with pt and reported that written HEP for passive stretching throughout RUE given to pt and mother last session looked manageable and helpful and did not report any questions at present.  Pt appeared excited and pleased to practice utilizing RUE as a stabilizer this day.  Noted muscle activation in supine for R shoulder add and IR, but pt had difficulty with muscle activation of these  muscle groups while ambulating, but OT encouraged pt continue to practice carrying light ADL supplies (ie pillow, towel) at home and reinforced that  this dual tasking takes practice.  Advised on increased caution when objects are dropped as to avoid tripping.  Also reinforced to pt to continue to practice using RUE as a stabilizer to open jars/bottles in kitchen as able to maximize indep with making light meals/snacks at home.  Pt will continue to benefit from skilled OT to provide neuro re-ed to RUE and ADL training in order to maximize indep with self care.    OT Occupational Profile and History Detailed Assessment- Review of Records and additional review of physical, cognitive, psychosocial history related to current functional performance    Occupational performance deficits (Please refer to evaluation for details): ADL's;IADL's;Leisure    Body Structure / Function / Physical Skills ADL;Coordination;GMC;Muscle spasms;UE functional use;Body mechanics;Flexibility;IADL;Pain;FMC;Strength;ROM;Tone    Rehab Potential Good    Clinical Decision Making Several treatment options, min-mod task modification necessary    Comorbidities Affecting Occupational Performance: Presence of comorbidities impacting occupational performance    Comorbidities impacting occupational performance description: R sided hemiplegia, hypertonicity, expressive aphasia    Modification or Assistance to Complete Evaluation  Min-Moderate modification of tasks or assist with assess necessary to complete eval    OT Frequency 2x / week    OT Duration 12 weeks    OT Treatment/Interventions Self-care/ADL training;Therapeutic exercise;DME and/or AE instruction;Neuromuscular education;Manual Therapy;Splinting;Moist Heat;Energy conservation;Passive range of motion;Therapeutic activities;Patient/family education;Electrical Stimulation    Consulted and Agree with Plan of Care Patient;Family member/caregiver             Patient will benefit  from skilled therapeutic intervention in order to improve the following deficits and impairments:   Body Structure / Function / Physical Skills: ADL, Coordination, GMC, Muscle spasms, UE functional use, Body mechanics, Flexibility, IADL, Pain, FMC, Strength, ROM, Tone       Visit Diagnosis: Muscle weakness (generalized)  Other lack of coordination  Apraxia  Aphasia    Problem List Patient Active Problem List   Diagnosis Date Noted   Hav (hallux abducto valgus), left 12/25/2018   [redacted] weeks gestation of pregnancy 07/19/2018   HPV (human papilloma virus) anogenital infection 06/14/2018   Tobacco smoking affecting pregnancy 06/14/2018   Supervision of high risk pregnancy in second trimester 02/26/2018   Lumbar radiculopathy 02/09/2017   Scoliosis of thoracolumbar spine 02/09/2017   Anemia due to vitamin B12 deficiency 06/07/2016   Paresthesia 05/16/2016   Thyroid nodule 05/16/2016   Anxiety and depression 03/16/2015   Fibromyalgia 02/08/2015   Galactorrhea 08/26/2013   Anxiety 02/04/2013   STD (female) 02/04/2013   Tobacco use 02/04/2013   Leta Speller, MS, OTR/L  Darleene Cleaver 05/12/2021, 8:36 AM  [place] MAIN [facility] SERVICES [address] [place], [place], [address] Phone: [phone]   Fax:  [phone]  Name: Amber Vance MRN: [id number] Date of Birth: 20-Apr-1986

## 2021-05-16 ENCOUNTER — Other Ambulatory Visit: Payer: Self-pay

## 2021-05-16 ENCOUNTER — Ambulatory Visit: Payer: Medicaid Other | Admitting: Occupational Therapy

## 2021-05-16 ENCOUNTER — Ambulatory Visit: Payer: Medicaid Other | Admitting: Speech Pathology

## 2021-05-16 ENCOUNTER — Ambulatory Visit: Payer: Medicaid Other

## 2021-05-16 DIAGNOSIS — R278 Other lack of coordination: Secondary | ICD-10-CM

## 2021-05-16 DIAGNOSIS — R4701 Aphasia: Secondary | ICD-10-CM | POA: Diagnosis not present

## 2021-05-16 DIAGNOSIS — R2689 Other abnormalities of gait and mobility: Secondary | ICD-10-CM

## 2021-05-16 DIAGNOSIS — M6281 Muscle weakness (generalized): Secondary | ICD-10-CM

## 2021-05-16 DIAGNOSIS — R2681 Unsteadiness on feet: Secondary | ICD-10-CM

## 2021-05-16 DIAGNOSIS — R482 Apraxia: Secondary | ICD-10-CM

## 2021-05-16 NOTE — Therapy (Signed)
[place] MAIN [facility] SERVICES [address] [place], [place], [address] Phone: [phone]   Fax:  [phone]  Speech Language Pathology Treatment  Patient Details  Name: Amber Vance MRN: [id number] Date of Birth: 03-31-1986 Referring Provider (SLP): Florencia Reasons MD   Encounter Date: 05/16/2021   End of Session - 05/16/21 1644     Visit Number 7    Number of Visits 13    Date for SLP Re-Evaluation 05/23/21    Authorization Type [employer]    Authorization - Visit Number 7    Progress Note Due on Visit 10    SLP Start Time 1445    SLP Stop Time  1545    SLP Time Calculation (min) 60 min    Activity Tolerance Patient tolerated treatment well             Past Medical History:  Diagnosis Date   Anxiety    Back pain    Depression     No past surgical history on file.  There were no vitals filed for this visit.   Subjective Assessment - 05/16/21 1625     Subjective "Oh dang!"    Currently in Pain? No/denies                   ADULT SLP TREATMENT - 05/16/21 1626       General Information   Behavior/Cognition Alert;Cooperative;Pleasant mood    HPI "Medical HX: Briefly, Amber Vance is a 35 y.o. female patient hx of recent DVT (on Eliquis) and anxiety who presented on 07/17/20 with of symptoms of acute right-sided numbness and altered LOC causing her to fall and hit her head while standing in her kitchen. 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 She underwent hemicraniectomy on 07/18/20 c/b herniation through the hemicrani site. Etiology of stroke remains cryptogenic but is likely embolic in setting of hypercoagulable state, though reason for underlying hypercoagulability is not clear.  Extensive hypercoagulability workup negative. Cranioplasty was attempted on 10/26 and aborted due to significant necrotic tissue. The hospital course was further c/b agitation, delirium, vitamin D deficiency, dysphagia (now s/p PEG removal on 11/19), anemia s/p IV dextran, bacteremia with Acetinobacter/Bacillus Positive cultures, rash c/w livedo reticularis."      Treatment Provided   Treatment provided Cognitive-Linquistic      Cognitive-Linquistic Treatment   Treatment focused on Aphasia;Patient/family/caregiver education    Skilled Treatment No new photos taken this weekend on pt's device. She was able to navigate to sections re: restaurants, her children and activate icons with min cues. With mod cues pt able to locate/activate icons she could use in different scenarios (brace adjustment, pain patch for arm, more gum). Pt wanted to record "I love you" in her own voice. Pt able to approximate with imitation and visual cues, however she was not satisfied with the production. Utilized picture cues as well as segmentation/imitation, however pt productions inconsistent. Updated with pt hobbies (birdwatching, puzzles, beach). Created icons for pt to add new photos at home (rooms of her house). Discussed aphasia groups and pt was very eager to particpate in [place] group for people with aphasia. Information has been provided to pt's mother's email re: aphasia group sign up.      Assessment /  Recommendations / Plan   Plan Continue with current plan of care      Progression Toward Goals   Progression toward goals Progressing toward goals              SLP Education - 05/16/21 1641     Education Details groups for aphasia    Person(s) Educated Patient;Parent(s)    Methods Explanation    Comprehension Verbalized understanding;Need further instruction                SLP Long Term Goals - 04/12/21 [place] #1   Title Patient will demonstrate functional communication  given support from communication partners    Baseline only those most familiar with patient can effectively communicate with patient    Time 6    Period Weeks    Status New    Target Date 05/23/21      SLP LONG TERM GOAL #2   Title Patient will name common objects, using multi-modal strategies, with 80% accuracy.    Baseline 10%    Time 6    Period Weeks    Status New    Target Date 05/23/21      SLP LONG TERM GOAL #3   Title Patient will follow simple directions, given muit-modal input, with 80% accuracy    Baseline 10%    Time 6    Period Weeks    Status New    Target Date 05/23/21      SLP LONG TERM GOAL #4   Title Patient will demonstrate reading comprehension at phrase level with 80% accuracy given min cues.    Baseline single words, 80%    Time 6    Period Weeks    Status New    Target Date 05/23/21              Plan - 05/16/21 1644     Clinical Impression Statement Patient continues with severe aphasia and apraxia characterized by global aphasia with greater expressive than receptive impairments. The patient has relative strengths in auditory and reading comprehension. Patient is navigating communication device and locating familiar items with min-mod cues. The patient has made improvements since the initial onset of aphasia and demonstrates high motivation to improve. While patient would continue to benefit from therapy at frequency of 2x per week, decision with pt/family was made to continue at frequency of 1-2x per week to prolong course of therapy due to limited insurance visits. The patient would benefit from skilled speech therapy for restorative and compensatory treatment of aphasia and apraxia.    Speech Therapy Frequency --   1-2x per week   Duration --   6 weeks   Treatment/Interventions Language facilitation;Patient/family education    Potential to Achieve Goals Good    Potential Considerations Ability to learn/carryover information;Severity of  impairments;Cooperation/participation level;Previous level of function;Family/community support;Financial resources    Consulted and Agree with Plan of Care Patient             Patient will benefit from skilled therapeutic intervention in order to improve the following deficits and impairments:   Aphasia  Apraxia    Problem List Patient Active Problem List   Diagnosis Date Noted   Hav (hallux abducto valgus), left 12/25/2018   [redacted] weeks gestation of pregnancy 07/19/2018   HPV (human papilloma virus) anogenital infection 06/14/2018   Tobacco smoking affecting pregnancy 06/14/2018   Supervision of high risk pregnancy in second trimester 02/26/2018  Lumbar radiculopathy 02/09/2017   Scoliosis of thoracolumbar spine 02/09/2017   Anemia due to vitamin B12 deficiency 06/07/2016   Paresthesia 05/16/2016   Thyroid nodule 05/16/2016   Anxiety and depression 03/16/2015   Fibromyalgia 02/08/2015   Galactorrhea 08/26/2013   Anxiety 02/04/2013   STD (female) 02/04/2013   Tobacco use 02/04/2013   Deneise Lever, [place], CCC-SLP Speech-Language Pathologist  Aliene Altes 05/16/2021, 4:45 PM  [place] MAIN [facility] SERVICES [address] [place], [place], [address] Phone: [phone]   Fax:  [phone]   Name: Gerardine Band MRN: [id number] Date of Birth: 19-Sep-1986

## 2021-05-16 NOTE — Therapy (Signed)
[place] MAIN [facility] SERVICES [address] [place], [place], [address] Phone: [phone]   Fax:  [phone]  Physical Therapy Treatment  Patient Details  Name: Amber Vance MRN: [id number] Date of Birth: 1986/03/28 Referring Provider (PT): Dr. Salome Holmes (PCP)/ Pearletha Forge MD (Neurologist)   Encounter Date: 05/16/2021   PT End of Session - 05/16/21 1625     Visit Number 6    Number of Visits 13    Date for PT Re-Evaluation 07/06/21    Authorization Type Medicaid- No authorization required for 12 visits; 27 annual visits combined PT/OT/ST    Authorization Time Period Cert 9/93/71-6/96/78    Authorization - Visit Number 5    Authorization - Number of Visits 7    PT Start Time 9381    PT Stop Time 1426    PT Time Calculation (min) 39 min    Equipment Utilized During Treatment Gait belt    Activity Tolerance Patient tolerated treatment well;No increased pain    Behavior During Therapy WFL for tasks assessed/performed             Past Medical History:  Diagnosis Date   Anxiety    Back pain    Depression     No past surgical history on file.  There were no vitals filed for this visit.   Subjective Assessment - 05/16/21 1606     Subjective Pt doing well today, no pain, still wearing a cushion in her thermoplastic AFO to protect the lateral ankle per dad. Pt able to go to [facility] over the weakend, [place] the [place] with assist from family. No other recent medication changes, no falls.    Patient is accompained by: Family member   Dad   Pertinent History Amber Vance is a 35yo Right handed F referred to OPPT after Left ACA/MCA stroke s/p hemicraniectomy.  PMH: smoking, DVT, PE. On 07/17/20 pt presented to ED c Rt hemi weakness/numbness, sustained LOC/fall with head trauma. CTA showed acute left ICA/MCA occlusion as well as proximal left ACA occlusion, dissection of the proximal left ICA distal to the bifurcation with  severe stenosis of the proximal left ICA. MRI showed complete infarct of the left hemisphere. Hemicraniectomy was done on 07/18/2020. Also found to have femoral DVT on 07/20/2020, now s/p IVC filter. Patient was in ICU for 2 months, [facility] for 7 weeks, then SNF for 3 months- DC to home 02/15/21 with assist from parents. Patient has 2 children (12yo, 2yo). Underwent cranial reconstruction on 04/19/21.  She has expressive aphasia. Since home pt AMB mostly household distances without device, intermittent SPC use especially when out of house. Pt uses a custom thermoplastic AFO RLE. Pt no longer using RUE sling, craniotomy helmet. No falls since leaving rehab.    Currently in Pain? No/denies             INTERVENTION THIS DATE: -AMB 169f loop, no device, CW -AMB 1575floop, no device , CCW -AMB 1557fn/off/over red fun-mat 4x  Appropriate hesitation, no device, no LOB, improved confidence after first 2 times -AMB to elevators with SPC ~[id number] sequencing SPC LUE paired with RLE, typical aforementioned deviations with spastic RLE hemiplegia, locked knee swingphase driven by trunk and adductors.  -Elevator ride up, supervision level  -AMB to [facility] behind [employer]: Up/down ramp 2x with LUE SPC  Mild increased effort going up, no obvious balance difficulty with either -Concrete stairs outside ramp up/down twice  Left railing up, left railing down; Cues for 'up with good down with bad', also needs tactile cues due error 30% of time.  -Concrete curb step down/up, SPC LUE, 3-point gait (Cane, RLE, LLE both down and up) ends up being easier than coming back up with "good leg" -AMB to elevator c SPC  -Elevator ride down  -AMB to waiting area s AD ~ 237f, supervision level      PT Education - 05/16/21 1624     Education Details safe performance of stairs, use of SPC    Person(s) Educated Patient;Parent(s)    Methods Explanation;Demonstration;Tactile cues    Comprehension  Verbalized understanding;Need further instruction;Tactile cues required;Verbal cues required              PT Short Term Goals - 05/10/21 1404       PT SHORT TERM GOAL #1   Title Patient will be adherent to HEP daily to improve strength and mobility    Baseline does not have a  HEP, 7/19: doing 1x a week    Time 4    Period Weeks    Status Partially Met    Target Date 04/27/21      PT SHORT TERM GOAL #2   Title Patient will be able to transfer sit<>Stand with feet in neutral alignment with equal weight shift between each LE.    Baseline currently shifts to LLE with RLE advanced forward and minimal weight bearing in RLE , 7/19: still has heavy lean to LLE    Time 4    Period Weeks    Status Not Met    Target Date 04/27/21      PT SHORT TERM GOAL #3   Title Patient will be able to hold SLS for 5 sec on RLE with finger tip hold or less to improve weight acceptance on RLE.    Baseline unable to achieve SLS, 7/19: unable    Time 4    Period Weeks    Status Not Met    Target Date 04/27/21               PT Long Term Goals - 05/10/21 1406       PT LONG TERM GOAL #1   Title Patient (< 628years old) will complete five times sit to stand test in < 10 seconds indicating an increased LE strength and improved balance.    Baseline 17.56 sec without HHA, 7/19: 18.5 sec    Time 8    Period Weeks    Status Not Met    Target Date 07/06/21      PT LONG TERM GOAL #2   Title Patient will increase 10 meter walk test to >0.8 m/s as to improve gait speed for better community ambulation and to reduce fall risk.    Baseline 0.5 m/s with SPC, 7/19: 0.58 m/s    Time 8    Period Weeks    Status Partially Met    Target Date 07/06/21      PT LONG TERM GOAL #3   Title Patient will increase RLE gross strength to 4+/5 in hip/knee as to improve functional strength for independent gait, increased standing tolerance and increased ADL ability.    Baseline 4-/5 to 3-/5 in RLE, 7/19: no change     Time 8    Period Weeks    Status Not Met    Target Date 07/06/21      PT LONG TERM GOAL #4   Title Patient  will improve FOTO score to >63% to indicate improved functional mobility with ADLs.    Baseline 60%, 7/19: 61%    Time 8    Period Weeks    Status Partially Met    Target Date 07/06/21                   Plan - 05/16/21 1627     Clinical Impression Statement Continued with advanced gait training with and without SPC, multi directional gait, incline, decline, stairs, and curbs. Pt AMB much of session, but is not frankly limited by fatigue. Pt has no LOB in session. Pt demonstrations appropriate caution at times with approaching navigation challenges, but remains motivated to progress her independence and capabilities. Dad present throughout session provides insght and details on home setup, precuations as needed. Pt conitnue to make excellent progress overall, will be out of her anual allowance of visits after 2 more sessions.    Personal Factors and Comorbidities Comorbidity 3+;Time since onset of injury/illness/exacerbation    Comorbidities DVT/clots, anxiety/depression, former smoker    Examination-Activity Limitations Caring for Others;Locomotion Level;Squat;Stairs;Stand;Transfers    Examination-Participation Restrictions Cleaning;Community Activity;Driving;Laundry;Shop;Volunteer;Yard Work;School;Occupation    Stability/Clinical Decision Making Stable/Uncomplicated    Clinical Decision Making Low    Rehab Potential Good    PT Frequency 1x / week    PT Duration 8 weeks    PT Treatment/Interventions Cryotherapy;Moist Heat;Gait training;Stair training;Functional mobility training;Therapeutic activities;Therapeutic exercise;Balance training;Neuromuscular re-education;Patient/family education;Orthotic Fit/Training;Passive range of motion;Energy conservation    PT Next Visit Plan Complete any final HEP recommendations and prepare for DC    PT Home Exercise Plan No updates  this visit.    Consulted and Agree with Plan of Care Patient;Family member/caregiver    Family Member Consulted dad- [name]             Patient will benefit from skilled therapeutic intervention in order to improve the following deficits and impairments:  Abnormal gait, Decreased balance, Decreased endurance, Decreased mobility, Difficulty walking, Increased muscle spasms, Impaired perceived functional ability, Decreased activity tolerance, Decreased coordination, Decreased safety awareness, Decreased strength  Visit Diagnosis: Muscle weakness (generalized)  Other lack of coordination  Unsteadiness on feet  Other abnormalities of gait and mobility     Problem List Patient Active Problem List   Diagnosis Date Noted   Hav (hallux abducto valgus), left 12/25/2018   [redacted] weeks gestation of pregnancy 07/19/2018   HPV (human papilloma virus) anogenital infection 06/14/2018   Tobacco smoking affecting pregnancy 06/14/2018   Supervision of high risk pregnancy in second trimester 02/26/2018   Lumbar radiculopathy 02/09/2017   Scoliosis of thoracolumbar spine 02/09/2017   Anemia due to vitamin B12 deficiency 06/07/2016   Paresthesia 05/16/2016   Thyroid nodule 05/16/2016   Anxiety and depression 03/16/2015   Fibromyalgia 02/08/2015   Galactorrhea 08/26/2013   Anxiety 02/04/2013   STD (female) 02/04/2013   Tobacco use 02/04/2013   4:42 PM, 05/16/21 Etta Grandchild, PT, DPT Physical Therapist - [facility]  Outpatient Physical Therapy- [place] [phone]     Etta Grandchild 05/16/2021, 4:34 PM  [place] MAIN [facility] SERVICES [address] [place], [place], [address] Phone: [phone]   Fax:  [phone]  Name: Zoie Sarin MRN: [id number] Date of Birth: [birth year]

## 2021-05-17 ENCOUNTER — Ambulatory Visit: Payer: Medicaid Other | Admitting: Physical Therapy

## 2021-05-18 ENCOUNTER — Encounter: Payer: Medicaid Other | Admitting: Speech Pathology

## 2021-05-18 ENCOUNTER — Ambulatory Visit: Payer: Medicaid Other | Admitting: Occupational Therapy

## 2021-05-18 ENCOUNTER — Ambulatory Visit: Payer: Medicaid Other | Admitting: Speech Pathology

## 2021-05-18 ENCOUNTER — Other Ambulatory Visit: Payer: Self-pay

## 2021-05-18 ENCOUNTER — Ambulatory Visit: Payer: Medicaid Other

## 2021-05-18 DIAGNOSIS — R278 Other lack of coordination: Secondary | ICD-10-CM

## 2021-05-18 DIAGNOSIS — R2681 Unsteadiness on feet: Secondary | ICD-10-CM

## 2021-05-18 DIAGNOSIS — R4701 Aphasia: Secondary | ICD-10-CM | POA: Diagnosis not present

## 2021-05-18 DIAGNOSIS — M6281 Muscle weakness (generalized): Secondary | ICD-10-CM

## 2021-05-18 DIAGNOSIS — R482 Apraxia: Secondary | ICD-10-CM

## 2021-05-18 NOTE — Therapy (Signed)
[place] MAIN [facility] SERVICES [address] [place], [place], [address] Phone: [phone]   Fax:  [phone]  Physical Therapy Treatment  Patient Details  Name: Amber Vance MRN: [id number] Date of Birth: 13-Jun-1986 Referring Provider (PT): Dr. Salome Holmes (PCP)/ Pearletha Forge MD (Neurologist)   Encounter Date: 05/18/2021   PT End of Session - 05/18/21 1516     Visit Number 7    Number of Visits 13    Date for PT Re-Evaluation 07/06/21    Authorization Type Medicaid- No authorization required for 12 visits; 27 annual visits combined PT/OT/ST    Authorization Time Period Cert 6/70/14-10/26/99    Authorization - Visit Number 6    Authorization - Number of Visits 7    PT Start Time 3143    PT Stop Time 1630    PT Time Calculation (min) 44 min    Equipment Utilized During Treatment Gait belt    Activity Tolerance Patient tolerated treatment well;No increased pain    Behavior During Therapy WFL for tasks assessed/performed             Past Medical History:  Diagnosis Date   Anxiety    Back pain    Depression     History reviewed. No pertinent surgical history.  There were no vitals filed for this visit.        TREATMENT:  Qped,  required mod A for positioning (Max A for placement of RUE to reduce flexor tone)- very challenging unable to fully flatten hand on table             lateral weight shift x5 reps with mod VCs and demonstration for proper technique;         Tall kneeling, 10x 5 second hold with cue for gluteal activation  Patient had increased tone at start of session having increased difficulty with qped positioning;     seated Sit to stand with dynadisc under LLE;to facilitate weight shift onto RLE throw balls into hoop 15x Seated on dynadisc: lateral weight shifts with patient able to return to center with mod I/CGA Seated on dynadisc: bilateral UE raise 10x very challenging for patient Seated on side  of plinth table: hamstring isometric into dynadisc 10x 5 second holds Seated soccer ball kicks for coordination, spatial awareness, and timing of muscle recruitment x 3 minutes   Standing in // bars; High knee marches 1x length of // bars very challenging for RLE  High knee march into GTB across bars 10x each LE, SUE support Stand on airex pad: throw ball with father with CGA from PT x 15 throws no LOB Hedgehog taps 6x each LE; alternating each LE  Backwards ambulation 2x length of // bars SUE support no LOB    Patient tolerated session well. She does report fatigue at end of session; Denies any pain;    Patient is highly motivated throughout physical therapy session. She has improved weight shift this session onto affected side. Standing stabilization interventions tolerated well with no LOB. Coordination of affected limb is challenging for patient requiring visual cues. She would benefit from additional skilled PT intervention to improve strength, balance and mobility;                      PT Education - 05/18/21 1516     Education Details exercise technique, body mechanics    Person(s) Educated Patient    Methods Explanation;Demonstration;Tactile cues;Verbal cues    Comprehension Verbalized understanding;Returned  demonstration;Verbal cues required;Tactile cues required              PT Short Term Goals - 05/10/21 1404       PT SHORT TERM GOAL #1   Title Patient will be adherent to HEP daily to improve strength and mobility    Baseline does not have a  HEP, 7/19: doing 1x a week    Time 4    Period Weeks    Status Partially Met    Target Date 04/27/21      PT SHORT TERM GOAL #2   Title Patient will be able to transfer sit<>Stand with feet in neutral alignment with equal weight shift between each LE.    Baseline currently shifts to LLE with RLE advanced forward and minimal weight bearing in RLE , 7/19: still has heavy lean to LLE    Time 4    Period Weeks     Status Not Met    Target Date 04/27/21      PT SHORT TERM GOAL #3   Title Patient will be able to hold SLS for 5 sec on RLE with finger tip hold or less to improve weight acceptance on RLE.    Baseline unable to achieve SLS, 7/19: unable    Time 4    Period Weeks    Status Not Met    Target Date 04/27/21               PT Long Term Goals - 05/10/21 1406       PT LONG TERM GOAL #1   Title Patient (< 21 years old) will complete five times sit to stand test in < 10 seconds indicating an increased LE strength and improved balance.    Baseline 17.56 sec without HHA, 7/19: 18.5 sec    Time 8    Period Weeks    Status Not Met    Target Date 07/06/21      PT LONG TERM GOAL #2   Title Patient will increase 10 meter walk test to >0.8 m/s as to improve gait speed for better community ambulation and to reduce fall risk.    Baseline 0.5 m/s with SPC, 7/19: 0.58 m/s    Time 8    Period Weeks    Status Partially Met    Target Date 07/06/21      PT LONG TERM GOAL #3   Title Patient will increase RLE gross strength to 4+/5 in hip/knee as to improve functional strength for independent gait, increased standing tolerance and increased ADL ability.    Baseline 4-/5 to 3-/5 in RLE, 7/19: no change    Time 8    Period Weeks    Status Not Met    Target Date 07/06/21      PT LONG TERM GOAL #4   Title Patient will improve FOTO score to >63% to indicate improved functional mobility with ADLs.    Baseline 60%, 7/19: 61%    Time 8    Period Weeks    Status Partially Met    Target Date 07/06/21                   Plan - 05/18/21 1655     Clinical Impression Statement Patient is highly motivated throughout physical therapy session. She has improved weight shift this session onto affected side. Standing stabilization interventions tolerated well with no LOB. Utilization of language book assisted with conversation and interactions throughout session.  Coordination of affected limb  is challenging for patient requiring visual cues. She would benefit from additional skilled PT intervention to improve strength, balance and mobility;    Personal Factors and Comorbidities Comorbidity 3+;Time since onset of injury/illness/exacerbation    Comorbidities DVT/clots, anxiety/depression, former smoker    Examination-Activity Limitations Caring for Others;Locomotion Level;Squat;Stairs;Stand;Transfers    Examination-Participation Restrictions Cleaning;Community Activity;Driving;Laundry;Shop;Volunteer;Yard Work;School;Occupation    Stability/Clinical Decision Making Stable/Uncomplicated    Rehab Potential Good    PT Frequency 1x / week    PT Duration 8 weeks    PT Treatment/Interventions Cryotherapy;Moist Heat;Gait training;Stair training;Functional mobility training;Therapeutic activities;Therapeutic exercise;Balance training;Neuromuscular re-education;Patient/family education;Orthotic Fit/Training;Passive range of motion;Energy conservation    PT Next Visit Plan Complete any final HEP recommendations and prepare for DC    PT Home Exercise Plan No updates this visit.    Consulted and Agree with Plan of Care Patient;Family member/caregiver    Family Member Consulted dad- [name]             Patient will benefit from skilled therapeutic intervention in order to improve the following deficits and impairments:  Abnormal gait, Decreased balance, Decreased endurance, Decreased mobility, Difficulty walking, Increased muscle spasms, Impaired perceived functional ability, Decreased activity tolerance, Decreased coordination, Decreased safety awareness, Decreased strength  Visit Diagnosis: Muscle weakness (generalized)  Other lack of coordination  Unsteadiness on feet     Problem List Patient Active Problem List   Diagnosis Date Noted   Hav (hallux abducto valgus), left 12/25/2018   [redacted] weeks gestation of pregnancy 07/19/2018   HPV (human papilloma virus) anogenital infection  06/14/2018   Tobacco smoking affecting pregnancy 06/14/2018   Supervision of high risk pregnancy in second trimester 02/26/2018   Lumbar radiculopathy 02/09/2017   Scoliosis of thoracolumbar spine 02/09/2017   Anemia due to vitamin B12 deficiency 06/07/2016   Paresthesia 05/16/2016   Thyroid nodule 05/16/2016   Anxiety and depression 03/16/2015   Fibromyalgia 02/08/2015   Galactorrhea 08/26/2013   Anxiety 02/04/2013   STD (female) 02/04/2013   Tobacco use 02/04/2013    Janna Arch, PT, DPT  05/18/2021, 4:57 PM  [place] MAIN [facility] SERVICES [address] [place], [place], [address] Phone: [phone]   Fax:  [phone]  Name: Amber Vance MRN: [id number] Date of Birth: 1986/03/21

## 2021-05-18 NOTE — Patient Instructions (Signed)
Use the [employer] device for the Zoom meeting! Eunise can use the "Me" section to introduce herself!  Take some pictures at the beach (house, things you like to do, restaurants, foods you like to eat).

## 2021-05-18 NOTE — Therapy (Signed)
[place] MAIN [facility] SERVICES [place], [place], [address] Phone: [phone]   Fax:  [phone]  Speech Language Pathology Treatment  Patient Details  Name: Amber Vance MRN: [id number] Date of Birth: [birth year] Referring Provider (SLP): Florencia Reasons MD   Encounter Date: 05/18/2021   End of Session - 05/18/21 1539     Visit Number 8    Number of Visits 13    Date for SLP Re-Evaluation 05/23/21    Authorization Type [employer]    Authorization - Visit Number 8    Progress Note Due on Visit 10    SLP Start Time 1400    SLP Stop Time  1500    SLP Time Calculation (min) 60 min    Activity Tolerance Patient tolerated treatment well             Past Medical History:  Diagnosis Date   Anxiety    Back pain    Depression     No past surgical history on file.  There were no vitals filed for this visit.   Subjective Assessment - 05/18/21 1518     Subjective Father reports pt is signed up for Zoom aphasia meet-up on Friday    Patient is accompained by: Family member   dad   Currently in Pain? No/denies                   ADULT SLP TREATMENT - 05/18/21 1524       General Information   Behavior/Cognition Alert;Cooperative;Pleasant mood    HPI "Medical HX: Briefly, Amber Vance is a 35 y.o. female patient hx of recent DVT (on Eliquis) and anxiety who presented on 07/17/20 with of symptoms of acute right-sided numbness and altered LOC causing her to fall and hit her head while standing in her kitchen. 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 She underwent hemicraniectomy on 07/18/20 c/b herniation through the hemicrani site. Etiology of stroke remains cryptogenic but is likely embolic in  setting of hypercoagulable state, though reason for underlying hypercoagulability is not clear. Extensive hypercoagulability workup negative. Cranioplasty was attempted on 10/26 and aborted due to significant necrotic tissue. The hospital course was further c/b agitation, delirium, vitamin D deficiency, dysphagia (now s/p PEG removal on 11/19), anemia s/p IV dextran, bacteremia with Acetinobacter/Bacillus Positive cultures, rash c/w livedo reticularis."      Treatment Provided   Treatment provided Cognitive-Linquistic      Cognitive-Linquistic Treatment   Treatment focused on Aphasia;Patient/family/caregiver education    Skilled Treatment Targeted functional communication as well as apraxia by creating icons on pt's device to aid her in introducing herself. Pt imitated functional 3-4 word phrases (My name is Amber Vance, I am 63, I have two kids, Amber Vance is [age]) with intelligible approximations with initial mod-max cues (written, verbal, visual, and tactile), fading to visual cues only (SLP giving silent articulatory cues). Also customized content for pt to share about her hobbies and about her stroke. Pt said "dang!" and nodded her head, gestured in response to icons sharing information about aphasia. Role played pt giving introduction by SLP asking pt personal questions. Pt responded using icons on her device. She smiled and also became tearful. With moderate cues, pt used AAC device to  introduce herself to unfamiliar OT.      Assessment / Recommendations / Plan   Plan Continue with current plan of care      Progression Toward Goals   Progression toward goals Progressing toward goals              SLP Education - 05/18/21 1534     Education Details personal introduction with AAC device                SLP Long Term Goals - 04/12/21 1540       SLP LONG TERM GOAL #1   Title Patient will demonstrate functional communication given support from communication partners    Baseline only those  most familiar with patient can effectively communicate with patient    Time 6    Period Weeks    Status New    Target Date 05/23/21      SLP LONG TERM GOAL #2   Title Patient will name common objects, using multi-modal strategies, with 80% accuracy.    Baseline 10%    Time 6    Period Weeks    Status New    Target Date 05/23/21      SLP LONG TERM GOAL #3   Title Patient will follow simple directions, given muit-modal input, with 80% accuracy    Baseline 10%    Time 6    Period Weeks    Status New    Target Date 05/23/21      SLP LONG TERM GOAL #4   Title Patient will demonstrate reading comprehension at phrase level with 80% accuracy given min cues.    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 Amber Vance was able to use her communication device to introduce herself with moderate cues today. She appeared both excited and emotional about this. She remains highly motivated and eager to communicate. While patient would continue to benefit from therapy at frequency of 2x per week, decision with pt/family was made to continue at frequency of 1-2x per week to prolong course of therapy due to limited insurance visits. The patient would benefit from skilled speech therapy for restorative and compensatory treatment of aphasia and apraxia.    Speech Therapy Frequency --   1-2x per week   Duration --   6 weeks   Treatment/Interventions Language facilitation;Patient/family education    Potential to Achieve Goals Good    Potential Considerations Ability to learn/carryover information;Severity of impairments;Cooperation/participation level;Previous level of function;Family/community support;Financial resources    Consulted and Agree with Plan of  Care Patient             Patient will benefit from skilled therapeutic intervention in order to improve the following deficits and impairments:   Aphasia  Apraxia    Problem List Patient Active Problem List   Diagnosis Date Noted   Hav (hallux abducto valgus), left 12/25/2018   [redacted] weeks gestation of pregnancy 07/19/2018   HPV (human papilloma virus) anogenital infection 06/14/2018   Tobacco smoking affecting pregnancy 06/14/2018   Supervision of high risk pregnancy in second trimester 02/26/2018   Lumbar radiculopathy 02/09/2017   Scoliosis of thoracolumbar spine 02/09/2017  Anemia due to vitamin B12 deficiency 06/07/2016   Paresthesia 05/16/2016   Thyroid nodule 05/16/2016   Anxiety and depression 03/16/2015   Fibromyalgia 02/08/2015   Galactorrhea 08/26/2013   Anxiety 02/04/2013   STD (female) 02/04/2013   Tobacco use 02/04/2013   Deneise Lever, [place], CCC-SLP Speech-Language Pathologist  Amber Vance 05/18/2021, 3:41 PM  [place] MAIN [facility] SERVICES [address] [place], [place], [address] Phone: [phone]   Fax:  [phone]   Name: Amber Vance MRN: [id number] Date of Birth: 10/19/1986

## 2021-05-19 ENCOUNTER — Encounter: Payer: Medicaid Other | Admitting: Speech Pathology

## 2021-05-19 ENCOUNTER — Ambulatory Visit: Payer: Medicaid Other | Admitting: Physical Therapy

## 2021-05-20 ENCOUNTER — Encounter: Payer: Self-pay | Admitting: Occupational Therapy

## 2021-05-20 NOTE — Therapy (Signed)
[place] MAIN [facility] SERVICES [address] [place], [place], [address] Phone: [phone]   Fax:  [phone]  Occupational Therapy Treatment  Patient Details  Name: Amber Vance MRN: [id number] Date of Birth: 11-Feb-1986 Referring Provider (OT): Dr. Salome Holmes   Encounter Date: 05/18/2021   OT End of Session - 05/20/21 1718     Visit Number 6    Number of Visits 10    Date for OT Re-Evaluation 07/01/21    Authorization - Visit Number 6    Authorization - Number of Visits 10    OT Start Time 1500    OT Stop Time 1545    OT Time Calculation (min) 45 min    Equipment Utilized During Treatment cane, wc    Activity Tolerance Patient tolerated treatment well    Behavior During Therapy [facility] for tasks assessed/performed             Past Medical History:  Diagnosis Date   Anxiety    Back pain    Depression     History reviewed. No pertinent surgical history.  There were no vitals filed for this visit.   Subjective Assessment - 05/20/21 1715     Subjective  Pt using her new tablet to introduce herself to therapist and to convey tidbits about herself and her condition.    Pertinent History hx of DVTs/PEs    Patient Stated Goals To increase function in R dominant UE    Currently in Pain? No/denies    Pain Score 0-No pain             Neuro re-ed: Pt in supine on mat to perform passive stretching to RUE all planes for shoulder, elbow, wrist, and hand, working to reduce tone and prep for AAROM.  Facilitation techniques to perform AAROM for R shoulder in supine, horiz abd/add, abd/add, and elbow extension.  Additional focus on shoulder add and IR to hold objects with the arm acting as a stabilizer. WB exercises through RUE while sitting EOB, therapist assisting to position R hand flat on mat, assisting with lateral shifting, and practiced sit to stand with bearing more weight into RUE and RLE, requiring min A for weight  shifting as she worked on in last session.  Pt requires cues to slow down when performing these tasks to decrease impulsive movements which result in substitution of movements rather than reinforcing normal movement patterns.   Pt working towards being able to carry items under her right arm with functional mobility.  Pt demonstrates decreased awareness of position of objects and cannot always recognize when the item falls out of her arm until she stops ambulation and looks down for the item.  Pt worked on transitional movements with and without items under her arm, cues for recognizing when items are about to fall, repositioning of the items and slowing down reaction to picking up item after it has fallen.    Response to tx: Patient with some emotional lability in the beginning of session after utilizing the tablet to introduce herself, she indicates she is very grateful for speech therapy for helping her be able to communicate again.  Pt responds well to cues during session.  She tends to move quickly at times and benefits from slowing down of movement patterns and increased focus and attention to task.  She indicates excitement to be able to use right arm in any way possible.  Encouraged patient to continue with exercises at home with family  assisting.  Continues to demonstrate increased spasticity in right UE, assist to place right arm in weight bearing position.  Continue to work on tone inhibition techniques, attempts to normalize movement patterns and increase active ROM towards functional use of right UE for daily tasks.                       OT Education - 05/20/21 1718     Education Details Instruction in strategies to utilize RUE as a stabilizer, HEP, ROM    Person(s) Educated Patient;Parent(s)    Methods Explanation;Verbal cues;Demonstration    Comprehension Verbalized understanding;Verbal cues required              OT Short Term Goals - 04/12/21 0813       OT  SHORT TERM GOAL #1   Title Pt will perform RUE HEP independently.    Baseline Eval: initiated today with further instruction needed.    Time 12    Period Weeks    Status New    Target Date 07/01/21               OT Long Term Goals - 04/12/21 0814       OT LONG TERM GOAL #1   Title Pt will use RUE as a fair stabilizer during ADL comletion.    Baseline Eval: Pt uses LUE to complete all functional activity.    Time 12    Period Weeks    Status New    Target Date 07/01/21      OT LONG TERM GOAL #2   Title Pt will demonstrate and incorporate RUE tone reduction strategies into ADL completion with min vc from caregiver.    Baseline Eval: dependent; education needed    Time 35    Period Weeks    Status New    Target Date 07/01/21      OT LONG TERM GOAL #3   Title RUE MMT to increase by 1 MM grade to work towards increased functional use of RUE as a stabilizer for ADLs.    Baseline Eval: MMT 0/5 throughout RUE.    Time 12    Period Weeks    Status New    Target Date 07/01/21      OT LONG TERM GOAL #4   Title Pt will increase FOTO score to 35 or better to indicate increased functional performance for ADLs.    Baseline Eval: FOTO 24    Time 12    Period Weeks    Status New    Target Date 07/01/21                   Plan - 05/20/21 1719     Clinical Impression Statement Patient with some emotional lability in the beginning of session after utilizing the tablet to introduce herself, she indicates she is very grateful for speech therapy for helping her be able to communicate again.  Pt responds well to cues during session.  She tends to move quickly at times and benefits from slowing down of movement patterns and increased focus and attention to task.  She indicates excitement to be able to use right arm in any way possible.  Encouraged patient to continue with exercises at home with family assisting.  Continues to demonstrate increased spasticity in right UE, assist  to place right arm in weight bearing position.  Continue to work on tone inhibition techniques, attempts to normalize movement patterns and increase active ROM towards  functional use of right UE for daily tasks.    OT Occupational Profile and History Detailed Assessment- Review of Records and additional review of physical, cognitive, psychosocial history related to current functional performance    Occupational performance deficits (Please refer to evaluation for details): ADL's;IADL's;Leisure    Body Structure / Function / Physical Skills ADL;Coordination;GMC;Muscle spasms;UE functional use;Body mechanics;Flexibility;IADL;Pain;FMC;Strength;ROM;Tone    Rehab Potential Good    Clinical Decision Making Several treatment options, min-mod task modification necessary    Comorbidities Affecting Occupational Performance: Presence of comorbidities impacting occupational performance    Comorbidities impacting occupational performance description: R sided hemiplegia, hypertonicity, expressive aphasia    Modification or Assistance to Complete Evaluation  Min-Moderate modification of tasks or assist with assess necessary to complete eval    OT Frequency 2x / week    OT Duration 12 weeks    OT Treatment/Interventions Self-care/ADL training;Therapeutic exercise;DME and/or AE instruction;Neuromuscular education;Manual Therapy;Splinting;Moist Heat;Energy conservation;Passive range of motion;Therapeutic activities;Patient/family education;Electrical Stimulation    Consulted and Agree with Plan of Care Patient;Family member/caregiver             Patient will benefit from skilled therapeutic intervention in order to improve the following deficits and impairments:   Body Structure / Function / Physical Skills: ADL, Coordination, GMC, Muscle spasms, UE functional use, Body mechanics, Flexibility, IADL, Pain, FMC, Strength, ROM, Tone       Visit Diagnosis: Muscle weakness (generalized)  Other lack of  coordination  Unsteadiness on feet    Problem List Patient Active Problem List   Diagnosis Date Noted   Hav (hallux abducto valgus), left 12/25/2018   [redacted] weeks gestation of pregnancy 07/19/2018   HPV (human papilloma virus) anogenital infection 06/14/2018   Tobacco smoking affecting pregnancy 06/14/2018   Supervision of high risk pregnancy in second trimester 02/26/2018   Lumbar radiculopathy 02/09/2017   Scoliosis of thoracolumbar spine 02/09/2017   Anemia due to vitamin B12 deficiency 06/07/2016   Paresthesia 05/16/2016   Thyroid nodule 05/16/2016   Anxiety and depression 03/16/2015   Fibromyalgia 02/08/2015   Galactorrhea 08/26/2013   Anxiety 02/04/2013   STD (female) 02/04/2013   Tobacco use 02/04/2013   Gibson Telleria T Telesa Jeancharles, OTR/L, CLT  Talon Regala 05/20/2021, 5:50 PM  [place] [facility] MAIN [facility] SERVICES [address] [place], [place], [address] Phone: [phone]   Fax:  [phone]  Name: Amber Vance MRN: [id number] Date of Birth: 01-14-1986

## 2021-05-23 ENCOUNTER — Ambulatory Visit: Payer: Medicaid Other | Admitting: Occupational Therapy

## 2021-05-23 ENCOUNTER — Ambulatory Visit: Payer: Medicaid Other | Attending: Family Medicine

## 2021-05-23 ENCOUNTER — Other Ambulatory Visit: Payer: Self-pay | Admitting: Family Medicine

## 2021-05-23 ENCOUNTER — Other Ambulatory Visit: Payer: Self-pay

## 2021-05-23 ENCOUNTER — Encounter: Payer: Self-pay | Admitting: Occupational Therapy

## 2021-05-23 DIAGNOSIS — R278 Other lack of coordination: Secondary | ICD-10-CM | POA: Diagnosis present

## 2021-05-23 DIAGNOSIS — R2681 Unsteadiness on feet: Secondary | ICD-10-CM

## 2021-05-23 DIAGNOSIS — M6281 Muscle weakness (generalized): Secondary | ICD-10-CM

## 2021-05-23 DIAGNOSIS — R2689 Other abnormalities of gait and mobility: Secondary | ICD-10-CM

## 2021-05-23 DIAGNOSIS — E041 Nontoxic single thyroid nodule: Secondary | ICD-10-CM

## 2021-05-23 NOTE — Therapy (Signed)
[place] MAIN [facility] SERVICES [address] [place], [place], [address] Phone: [phone]   Fax:  [phone]  Physical Therapy Treatment  Patient Details  Name: Amber Vance MRN: [id number] Date of Birth: 07-Jan-1986 Referring Provider (PT): Dr. Salome Holmes (PCP)/ Pearletha Forge MD (Neurologist)   Encounter Date: 05/23/2021   PT End of Session - 05/23/21 1351     Visit Number 8    Number of Visits 13    Date for PT Re-Evaluation 07/06/21    Authorization Type Medicaid- No authorization required for 12 visits; 27 annual visits combined PT/OT/ST    Authorization Time Period Cert 6/96/29-03/19/40    Authorization - Visit Number 6    Authorization - Number of Visits 7    PT Start Time 3244    PT Stop Time 1348    PT Time Calculation (min) 49 min    Equipment Utilized During Treatment Gait belt    Activity Tolerance Patient tolerated treatment well;No increased pain    Behavior During Therapy WFL for tasks assessed/performed             Past Medical History:  Diagnosis Date   Anxiety    Back pain    Depression     History reviewed. No pertinent surgical history.  There were no vitals filed for this visit.   Subjective Assessment - 05/23/21 1302     Subjective Pt denies falls, medication changes. No pain to report.    Patient is accompained by: Family member   Dad   Pertinent History Amber Vance is a 35yo Right handed F referred to OPPT after Left ACA/MCA stroke s/p hemicraniectomy.  PMH: smoking, DVT, PE. On 07/17/20 pt presented to ED c Rt hemi weakness/numbness, sustained LOC/fall with head trauma. CTA showed acute left ICA/MCA occlusion as well as proximal left ACA occlusion, dissection of the proximal left ICA distal to the bifurcation with severe stenosis of the proximal left ICA. MRI showed complete infarct of the left hemisphere. Hemicraniectomy was done on 07/18/2020. Also found to have femoral DVT on 07/20/2020, now s/p IVC  filter. Patient was in ICU for 2 months, [facility] for 7 weeks, then SNF for 3 months- DC to home 02/15/21 with assist from parents. Patient has 2 children (12yo, 2yo). Underwent cranial reconstruction on 04/19/21.  She has expressive aphasia. Since home pt AMB mostly household distances without device, intermittent SPC use especially when out of house. Pt uses a custom thermoplastic AFO RLE. Pt no longer using RUE sling, craniotomy helmet. No falls since leaving rehab.    Currently in Pain? No/denies    Pain Score 0-No pain              Neuro Re-Ed: CGA provided throughout session for safety  Standing lateral weight shifts with LLE on dynadisc to promote increased Wb'ing on RLE. 2x10 reps.  STS with LLE on airex pad to promote increased Wb'ing on RLE. X12. VC's to reduce LUE on // bar to assist with standing. Use of mirror as visual input to optimize weight shift onto RLE.    // bars alternating walking cone taps with intermittent 3 finger assist with LUE on // bar. X4 laps. Multimodal cuing and PT demo to optimize form/technique. Difficulty with LLE cone tap due to difficulty maintaining body weight on RLE without some form of LUE support.  Alternating 6" taps for LE coordination and hip flexor strength:  Intermittent LUE support with 3 fingers. VC's to reduce to 3  fingers. Multimodal cuing required to optimize ability to perform correctly. 2x10/LE  Tandem ball tosses with father laterally outside BOS:   X15 normal stance   X15 tandem (RLE under BOS)   X15 tandem (LLE under BOS)- Displayed lateral sway R > L  Shooting balls into basketball hoop with feet on airex pad: 2 baskets full of balls. X1 close distance, x1 further distance   Tic-Tac-Toe for R sided neglect, forward reach outside BOS and dual task with feet on airex pad: x3 games.    Post session pt required assist to correct foam padding at lateral ankle on AFO. Educated on use of adhesive velcro to prevent future movement of foam  during functional activity.    PT Education - 05/23/21 1303     Education Details form/technique with exercise.    Person(s) Educated Patient    Methods Explanation;Demonstration;Verbal cues;Tactile cues    Comprehension Verbalized understanding;Returned demonstration              PT Short Term Goals - 05/10/21 1404       PT SHORT TERM GOAL #1   Title Patient will be adherent to HEP daily to improve strength and mobility    Baseline does not have a  HEP, 7/19: doing 1x a week    Time 4    Period Weeks    Status Partially Met    Target Date 04/27/21      PT SHORT TERM GOAL #2   Title Patient will be able to transfer sit<>Stand with feet in neutral alignment with equal weight shift between each LE.    Baseline currently shifts to LLE with RLE advanced forward and minimal weight bearing in RLE , 7/19: still has heavy lean to LLE    Time 4    Period Weeks    Status Not Met    Target Date 04/27/21      PT SHORT TERM GOAL #3   Title Patient will be able to hold SLS for 5 sec on RLE with finger tip hold or less to improve weight acceptance on RLE.    Baseline unable to achieve SLS, 7/19: unable    Time 4    Period Weeks    Status Not Met    Target Date 04/27/21               PT Long Term Goals - 05/10/21 1406       PT LONG TERM GOAL #1   Title Patient (< 9 years old) will complete five times sit to stand test in < 10 seconds indicating an increased LE strength and improved balance.    Baseline 17.56 sec without HHA, 7/19: 18.5 sec    Time 8    Period Weeks    Status Not Met    Target Date 07/06/21      PT LONG TERM GOAL #2   Title Patient will increase 10 meter walk test to >0.8 m/s as to improve gait speed for better community ambulation and to reduce fall risk.    Baseline 0.5 m/s with SPC, 7/19: 0.58 m/s    Time 8    Period Weeks    Status Partially Met    Target Date 07/06/21      PT LONG TERM GOAL #3   Title Patient will increase RLE gross strength  to 4+/5 in hip/knee as to improve functional strength for independent gait, increased standing tolerance and increased ADL ability.    Baseline 4-/5 to 3-/5 in  RLE, 7/19: no change    Time 8    Period Weeks    Status Not Met    Target Date 07/06/21      PT LONG TERM GOAL #4   Title Patient will improve FOTO score to >63% to indicate improved functional mobility with ADLs.    Baseline 60%, 7/19: 61%    Time 8    Period Weeks    Status Partially Met    Target Date 07/06/21                   Plan - 05/23/21 1359     Clinical Impression Statement Pt continues to remain highly motivated to participate in PT. Pt displaying good standing stabilization with RLE on compliant surfaces, reaching forward and laterally outside BOS with no balance. Pt displaying most difficulty with sway with tandem stance with LLE underneath BOS with ball tosses as perturbation and with coordination tasks with alternating foot taps on 6" step. Pt will benefit from further skilled PT services to further progress dynamic balance and strength.    Personal Factors and Comorbidities Comorbidity 3+;Time since onset of injury/illness/exacerbation    Comorbidities DVT/clots, anxiety/depression, former smoker    Examination-Activity Limitations Caring for Others;Locomotion Level;Squat;Stairs;Stand;Transfers    Examination-Participation Restrictions Cleaning;Community Activity;Driving;Laundry;Shop;Volunteer;Yard Work;School;Occupation    Stability/Clinical Decision Making Stable/Uncomplicated    Rehab Potential Good    PT Frequency 1x / week    PT Duration 8 weeks    PT Treatment/Interventions Cryotherapy;Moist Heat;Gait training;Stair training;Functional mobility training;Therapeutic activities;Therapeutic exercise;Balance training;Neuromuscular re-education;Patient/family education;Orthotic Fit/Training;Passive range of motion;Energy conservation    PT Next Visit Plan Complete any final HEP recommendations and  prepare for DC    PT Home Exercise Plan No updates this visit.    Consulted and Agree with Plan of Care Patient;Family member/caregiver    Family Member Consulted dad- [name]             Patient will benefit from skilled therapeutic intervention in order to improve the following deficits and impairments:  Abnormal gait, Decreased balance, Decreased endurance, Decreased mobility, Difficulty walking, Increased muscle spasms, Impaired perceived functional ability, Decreased activity tolerance, Decreased coordination, Decreased safety awareness, Decreased strength  Visit Diagnosis: Muscle weakness (generalized)  Other lack of coordination  Unsteadiness on feet  Other abnormalities of gait and mobility     Problem List Patient Active Problem List   Diagnosis Date Noted   Hav (hallux abducto valgus), left 12/25/2018   [redacted] weeks gestation of pregnancy 07/19/2018   HPV (human papilloma virus) anogenital infection 06/14/2018   Tobacco smoking affecting pregnancy 06/14/2018   Supervision of high risk pregnancy in second trimester 02/26/2018   Lumbar radiculopathy 02/09/2017   Scoliosis of thoracolumbar spine 02/09/2017   Anemia due to vitamin B12 deficiency 06/07/2016   Paresthesia 05/16/2016   Thyroid nodule 05/16/2016   Anxiety and depression 03/16/2015   Fibromyalgia 02/08/2015   Galactorrhea 08/26/2013   Anxiety 02/04/2013   STD (female) 02/04/2013   Tobacco use 02/04/2013    Salem Caster. Fairly IV, PT, DPT Physical Therapist- [facility]  05/23/2021, 2:13 PM  [place] MAIN [facility] SERVICES [address] [place], [place], [address] Phone: [phone]   Fax:  [phone]  Name: Audie Wieser MRN: [id number] Date of Birth: 10-Nov-1985

## 2021-05-23 NOTE — Therapy (Signed)
[place] MAIN [facility] SERVICES [address] [place], [place], [address] Phone: [phone]   Fax:  [phone]  Occupational Therapy Treatment/Discharge Note  Patient Details  Name: Amber Vance MRN: [id number] Date of Birth: 1986/05/06 Referring Provider (OT): Dr. Salome Holmes   Encounter Date: 05/23/2021   OT End of Session - 05/23/21 1535     Visit Number 7    Number of Visits 10    Date for OT Re-Evaluation 07/01/21    Authorization - Visit Number 7    Authorization - Number of Visits 10    OT Start Time 1194    OT Stop Time 1430    OT Time Calculation (min) 45 min    Activity Tolerance Patient tolerated treatment well    Behavior During Therapy [facility] for tasks assessed/performed             Past Medical History:  Diagnosis Date   Anxiety    Back pain    Depression     History reviewed. No pertinent surgical history.  There were no vitals filed for this visit.   Subjective Assessment - 05/23/21 1534     Subjective  [place]    Patient is accompanied by: Family member    Patient Stated Goals To increase function in R dominant UE    Currently in Pain? No/denies            Pt.'s father reports that today is going to be the pt.'s last treatment session, as they are planning to relocate, and [place] the beach. Pt./caregiver education was provided about UE ROM, inhibitory techniques for the RUE, Botox, weightbearing, and proprioception. Pt. continues to present with increased tone, and tightness in the RUE, and hand. Pt. is responding well to weightbearing, and proprioceptive input through the RUE, and hand. Pt. tolerated PROM in all joint ranges of motion, and alternating weightbearing with facilitation of active wrist, and digit extension. Pt.'s FOTO score is 37. Pt. continues to focus on increasing engagement of her RUE during ADL tasks. Pt. Continues to present with decreased awareness  of her RUE when items are falling from under her RUE.  .  OT Long Term Goals - 05/23/2021      OT LONG TERM GOAL #1   Title Pt will use RUE as a fair stabilizer during ADL comletion.    Baseline D/c: pt. Attempts to stabilize items under her arm, however presents with decreased awareness of when items fall out. Eval: Pt uses LUE to complete all functional activity.    Time 12    Period Weeks    Status Not met   Target Date 07/01/21      OT LONG TERM GOAL #2   Title Pt will demonstrate and incorporate RUE tone reduction strategies into ADL completion with min vc from caregiver.    Baseline D/c: pt. And caregiver education has been provided about implementing tone reduction strategies for the RUE. Eval: dependent; education needed    Time 12    Period Weeks    Status Not met   Target Date 07/01/21      OT LONG TERM GOAL #3   Title RUE MMT to increase by 1 MM grade to work towards increased functional use of RUE as a stabilizer for ADLs.    Baseline D/c: reflexive responses, and clonus with attempts for active movement. Eval: MMT 0/5 throughout RUE.    Time 12  Period Weeks    Status Not met   Target Date 07/01/21      OT LONG TERM GOAL #4   Title Pt will increase FOTO score to 35 or better to indicate increased functional performance for ADLs.    Baseline D/C: FOTO: 37 Eval: FOTO 24    Time 12    Period Weeks    Status Met   Target Date 07/01/21                               OT Education - 05/23/21 1535     Education Details Instruction in strategies to utilize RUE as a stabilizer, HEP, ROM    Person(s) Educated Patient;Parent(s)    Methods Explanation;Verbal cues;Demonstration    Comprehension Verbalized understanding;Verbal cues required              OT Short Term Goals - 04/12/21 0813       OT SHORT TERM GOAL #1   Title Pt will perform RUE HEP independently.    Baseline Eval: initiated today with further instruction needed.    Time 12     Period Weeks    Status New    Target Date 07/01/21               OT Long Term Goals - 04/12/21 0814       OT LONG TERM GOAL #1   Title Pt will use RUE as a fair stabilizer during ADL comletion.    Baseline Eval: Pt uses LUE to complete all functional activity.    Time 12    Period Weeks    Status New    Target Date 07/01/21      OT LONG TERM GOAL #2   Title Pt will demonstrate and incorporate RUE tone reduction strategies into ADL completion with min vc from caregiver.    Baseline Eval: dependent; education needed    Time 49    Period Weeks    Status New    Target Date 07/01/21      OT LONG TERM GOAL #3   Title RUE MMT to increase by 1 MM grade to work towards increased functional use of RUE as a stabilizer for ADLs.    Baseline Eval: MMT 0/5 throughout RUE.    Time 12    Period Weeks    Status New    Target Date 07/01/21      OT LONG TERM GOAL #4   Title Pt will increase FOTO score to 35 or better to indicate increased functional performance for ADLs.    Baseline Eval: FOTO 24    Time 12    Period Weeks    Status New    Target Date 07/01/21                   Plan - 05/23/21 1536     Clinical Impression Statement Pt.'s father reports that today is going to be the pt.'s last treatment session, as they are planning to relocate, and [place] the [place]. Pt./caregiver education was provided about UE ROM, inhibitory techniques for the RUE, Botox, weightbearing, and proprioception. Pt. continues to present with increased tone, and tightness in the RUE, and hand. Pt. is responding well to weightbearing, and proprioceptive input through the RUE, and hand. Pt. tolerated PROM in all joint ranges of motion, and alternating weightbearing with facilitation of active wrist, and digit extension. Pt.'s FOTO score is 37.  Pt. continues to focus on increasing engagement of her RUE during ADL tasks. Pt. Continues to present with decreased awareness of her RUE when items are  falling from under her RUE.    OT Occupational Profile and History Detailed Assessment- Review of Records and additional review of physical, cognitive, psychosocial history related to current functional performance    Occupational performance deficits (Please refer to evaluation for details): ADL's;IADL's;Leisure    Body Structure / Function / Physical Skills ADL;Coordination;GMC;Muscle spasms;UE functional use;Body mechanics;Flexibility;IADL;Pain;FMC;Strength;ROM;Tone    Rehab Potential Good    Clinical Decision Making Several treatment options, min-mod task modification necessary    Comorbidities Affecting Occupational Performance: Presence of comorbidities impacting occupational performance    Comorbidities impacting occupational performance description: R sided hemiplegia, hypertonicity, expressive aphasia    Modification or Assistance to Complete Evaluation  Min-Moderate modification of tasks or assist with assess necessary to complete eval    OT Frequency 2x / week    OT Duration 12 weeks    OT Treatment/Interventions Self-care/ADL training;Therapeutic exercise;DME and/or AE instruction;Neuromuscular education;Manual Therapy;Splinting;Moist Heat;Energy conservation;Passive range of motion;Therapeutic activities;Patient/family education;Electrical Stimulation    Consulted and Agree with Plan of Care Patient;Family member/caregiver             Patient will benefit from skilled therapeutic intervention in order to improve the following deficits and impairments:   Body Structure / Function / Physical Skills: ADL, Coordination, GMC, Muscle spasms, UE functional use, Body mechanics, Flexibility, IADL, Pain, FMC, Strength, ROM, Tone       Visit Diagnosis: Muscle weakness (generalized)  Other lack of coordination    Problem List Patient Active Problem List   Diagnosis Date Noted   Hav (hallux abducto valgus), left 12/25/2018   [redacted] weeks gestation of pregnancy 07/19/2018   HPV  (human papilloma virus) anogenital infection 06/14/2018   Tobacco smoking affecting pregnancy 06/14/2018   Supervision of high risk pregnancy in second trimester 02/26/2018   Lumbar radiculopathy 02/09/2017   Scoliosis of thoracolumbar spine 02/09/2017   Anemia due to vitamin B12 deficiency 06/07/2016   Paresthesia 05/16/2016   Thyroid nodule 05/16/2016   Anxiety and depression 03/16/2015   Fibromyalgia 02/08/2015   Galactorrhea 08/26/2013   Anxiety 02/04/2013   STD (female) 02/04/2013   Tobacco use 02/04/2013    Harrel Carina, MS,  OTR/L 05/23/2021, 3:46 PM  [place] MAIN [facility] SERVICES [address] [place], [place], [address] Phone: [phone]   Fax:  [phone]  Name: Amber Vance MRN: [id number] Date of Birth: 1985-11-08

## 2021-05-24 ENCOUNTER — Ambulatory Visit: Payer: Medicaid Other | Admitting: Physical Therapy

## 2021-05-24 ENCOUNTER — Ambulatory Visit: Payer: Medicaid Other | Admitting: Speech Pathology

## 2021-05-24 ENCOUNTER — Ambulatory Visit: Payer: Medicaid Other

## 2021-05-26 ENCOUNTER — Ambulatory Visit: Payer: Medicaid Other | Admitting: Physical Therapy

## 2021-05-26 ENCOUNTER — Encounter: Payer: Self-pay | Admitting: Physical Therapy

## 2021-05-26 ENCOUNTER — Encounter: Payer: Medicaid Other | Admitting: Speech Pathology

## 2021-05-26 DIAGNOSIS — M6281 Muscle weakness (generalized): Secondary | ICD-10-CM

## 2021-05-26 DIAGNOSIS — R2689 Other abnormalities of gait and mobility: Secondary | ICD-10-CM

## 2021-05-26 DIAGNOSIS — R278 Other lack of coordination: Secondary | ICD-10-CM

## 2021-05-26 DIAGNOSIS — R2681 Unsteadiness on feet: Secondary | ICD-10-CM

## 2021-05-26 NOTE — Therapy (Signed)
[place] MAIN [facility] SERVICES [address] [place], [place], [address] Phone: [phone]   Fax:  [phone]  May 26, 2021   No Recipients Referring physician: Dr. Salome Holmes  Physical Therapy Discharge Summary  Patient: Amber Vance  MRN: [id number]  Date of Birth: December 25, 1985   Diagnosis: Muscle weakness (generalized)  Other lack of coordination  Unsteadiness on feet  Other abnormalities of gait and mobility Referring Provider (PT): Dr. Salome Holmes (PCP)/ Pearletha Forge MD (Neurologist)   The above patient had been seen in Physical Therapy 8 times of 13 treatments scheduled with 0 no shows and 5 cancellations.  The treatment consisted of strengthening, gait training, balance training, HEP The patient is: Improved  Subjective: Patient cancelled remaining appointments since she moving to the [facility].  Discharge Findings: Please see last progress note/recert on 5/48/83 for last objective findings.   Functional Status at Discharge: Patient is ambulatory with RLE AFO with slight unsteadiness requiring close supervision for safety;   Goals Partially Met    Sincerely,   Laylamarie Meuser, PT, DPT   CC No Recipients  [place], [place], [address] Phone: [phone]   Fax:  [phone]  Patient: [name]  MRN: [id number]  Date of Birth: 11-07-1985

## 2021-05-30 ENCOUNTER — Other Ambulatory Visit: Payer: Self-pay

## 2021-05-30 ENCOUNTER — Ambulatory Visit
Admission: RE | Admit: 2021-05-30 | Discharge: 2021-05-30 | Disposition: A | Discharge status: Home / Self Care | Payer: Medicaid Other | Source: Ambulatory Visit | Attending: Family Medicine | Admitting: Family Medicine

## 2021-05-30 DIAGNOSIS — E041 Nontoxic single thyroid nodule: Secondary | ICD-10-CM | POA: Diagnosis present

## 2021-05-31 ENCOUNTER — Ambulatory Visit: Payer: Medicaid Other

## 2021-05-31 ENCOUNTER — Ambulatory Visit: Payer: Medicaid Other | Admitting: Speech Pathology

## 2021-06-02 ENCOUNTER — Ambulatory Visit: Payer: Medicaid Other

## 2021-06-02 ENCOUNTER — Ambulatory Visit: Payer: Medicaid Other | Admitting: Speech Pathology

## 2021-06-09 ENCOUNTER — Encounter: Payer: Medicaid Other | Admitting: Speech Pathology

## 2021-06-14 ENCOUNTER — Encounter: Payer: Medicaid Other | Admitting: Speech Pathology

## 2021-06-14 ENCOUNTER — Ambulatory Visit: Payer: Medicaid Other
